# Patient Record
Sex: Male | Born: 1970
Health system: Southern US, Community
[De-identification: ages and names within clinical notes are randomized; demographics above are authoritative.]

## PROBLEM LIST (undated history)

## (undated) DIAGNOSIS — Z8619 Personal history of other infectious and parasitic diseases: Secondary | ICD-10-CM

## (undated) DIAGNOSIS — M1990 Unspecified osteoarthritis, unspecified site: Secondary | ICD-10-CM

## (undated) DIAGNOSIS — E785 Hyperlipidemia, unspecified: Secondary | ICD-10-CM

## (undated) DIAGNOSIS — I10 Essential (primary) hypertension: Secondary | ICD-10-CM

## (undated) DIAGNOSIS — J302 Other seasonal allergic rhinitis: Secondary | ICD-10-CM

## (undated) HISTORY — DX: Unspecified osteoarthritis, unspecified site: M19.90

## (undated) HISTORY — DX: Essential (primary) hypertension: I10

## (undated) HISTORY — DX: Other seasonal allergic rhinitis: J30.2

## (undated) HISTORY — DX: Morbid (severe) obesity due to excess calories: E66.01

## (undated) HISTORY — DX: Hyperlipidemia, unspecified: E78.5

## (undated) HISTORY — DX: Personal history of other infectious and parasitic diseases: Z86.19

---

## 1988-02-26 HISTORY — PX: SHOULDER SURGERY: SHX246

## 2005-11-26 ENCOUNTER — Ambulatory Visit: Payer: Self-pay | Admitting: Internal Medicine

## 2005-12-03 ENCOUNTER — Ambulatory Visit: Payer: Self-pay | Admitting: Internal Medicine

## 2012-06-18 ENCOUNTER — Ambulatory Visit: Payer: Self-pay | Admitting: Family Medicine

## 2012-12-10 LAB — LIPID PANEL
Cholesterol: 223 mg/dL — AB (ref 0–200)
Direct LDL: 150
HDL: 168 mg/dL — AB (ref 35–70)

## 2012-12-10 LAB — CBC
HEMOGLOBIN: 13.6 g/dL
PLATELET COUNT: 239
WBC: 11.6

## 2012-12-10 LAB — PSA: PSA: 0.15

## 2012-12-10 LAB — COMPREHENSIVE METABOLIC PANEL
ALK PHOS: 69 U/L
ALT: 17
AST: 18 U/L
Albumin: 4.3
Creat: 1.41
EGFR: 54 mg/dL
GLUCOSE: 82
Potassium: 4.5 mmol/L
Sodium: 137 mmol/L (ref 137–147)
Total Bilirubin: 0.7 mg/dL

## 2012-12-10 LAB — TSH: TSH: 1.19

## 2013-09-16 ENCOUNTER — Encounter: Payer: Self-pay | Admitting: Family Medicine

## 2013-09-16 ENCOUNTER — Encounter (INDEPENDENT_AMBULATORY_CARE_PROVIDER_SITE_OTHER): Payer: Self-pay

## 2013-09-16 ENCOUNTER — Ambulatory Visit (INDEPENDENT_AMBULATORY_CARE_PROVIDER_SITE_OTHER): Payer: 59 | Admitting: Family Medicine

## 2013-09-16 VITALS — BP 124/78 | HR 100 | Temp 98.4°F | Ht 71.0 in | Wt 288.0 lb

## 2013-09-16 DIAGNOSIS — E669 Obesity, unspecified: Secondary | ICD-10-CM

## 2013-09-16 DIAGNOSIS — I10 Essential (primary) hypertension: Secondary | ICD-10-CM

## 2013-09-16 DIAGNOSIS — J309 Allergic rhinitis, unspecified: Secondary | ICD-10-CM

## 2013-09-16 DIAGNOSIS — E785 Hyperlipidemia, unspecified: Secondary | ICD-10-CM

## 2013-09-16 DIAGNOSIS — J302 Other seasonal allergic rhinitis: Secondary | ICD-10-CM

## 2013-09-16 MED ORDER — LISINOPRIL-HYDROCHLOROTHIAZIDE 20-25 MG PO TABS: 1.0000 | ORAL_TABLET | Freq: Every day | ORAL | Status: DC

## 2013-09-16 NOTE — Progress Notes (Signed)
   BP 124/78  Pulse 100  Temp(Src) 98.4 F (36.9 C) (Oral)  Ht 5\' 11"  (1.803 m)  Wt 288 lb (130.636 kg)  BMI 40.19 kg/m2   CC: new pt to establish  Subjective:    Patient ID: Jorge Armstrong, male    DOB: 1970/11/21, 43 y.o.   MRN: [id number]  HPI: Jorge Armstrong is a 43 y.o. male presenting on 09/16/2013 for Establish Care   [facility] Nehemiah Settle[facility]ite.  HTN - started bp meds November.  Compliant with linisopril hctz 20/25mg  daily in am. Doesn't check bp at home. No HA, vision changes, CP/tightness, SOB, leg swelling.   H/o HLD - off meds.   Morbid obesity - Body mass index is 40.19 kg/(m^2).    Wt Readings from Last 3 Encounters:  09/16/13 288 lb (130.636 kg)     Preventative: Last CPE 12/2012 Tdap 12/2012 Flu 12/2012  Lives with fiance and her son, 1 dog  Occ: Oxygen tank filler, part time job on weekends  Edu: bachelor's  Activity: no reg exercise  Diet: good water, fruits/vegetables some  Relevant past medical, surgical, family and social history reviewed and updated as indicated.  Allergies and medications reviewed and updated. No current outpatient prescriptions on file prior to visit.   No current facility-administered medications on file prior to visit.    Review of Systems Per HPI unless specifically indicated above    Objective:    BP 124/78  Pulse 100  Temp(Src) 98.4 F (36.9 C) (Oral)  Ht 5\' 11"  (1.803 m)  Wt 288 lb (130.636 kg)  BMI 40.19 kg/m2  Physical Exam  Nursing note and vitals reviewed. Constitutional: He appears well-developed and well-nourished. No distress.  HENT:  Mouth/Throat: Oropharynx is clear and moist. No oropharyngeal exudate.  Eyes: Conjunctivae and EOM are normal. Pupils are equal, round, and reactive to light. No scleral icterus.  Neck: No thyromegaly present.  Cardiovascular: Normal rate, regular rhythm, normal heart sounds and intact distal pulses.   No murmur heard. Pulmonary/Chest: Effort normal and breath sounds  normal. No respiratory distress. He has no wheezes. He has no rales.  Musculoskeletal: He exhibits no edema.  Lymphadenopathy:    He has no cervical adenopathy.  Skin: Skin is warm and dry. No rash noted.  Psychiatric: He has a normal mood and affect.   No results found for this or any previous visit.    Assessment & Plan:   Problem List Items Addressed This Visit   Seasonal allergies   Morbid obesity     Reviewed healthy diet and lifestyle changes. Body mass index is 40.19 kg/(m^2).    HTN (hypertension) - Primary     Improved on recheck. Reviewed low salt diet choices and encouraged choosing potassium rich foods - handout provided. DASH diet handout provided. HTN education info provided. States had EKG at prior PCP's office - will await records. No changes indicated today. Encourage working on weight loss and increased activity for better BP control.    Relevant Medications      lisinopril-hydrochlorothiazide (PRINZIDE,ZESTORETIC) 20-25 MG per tablet   HLD (hyperlipidemia)     FLP next visit.    Relevant Medications      lisinopril-hydrochlorothiazide (PRINZIDE,ZESTORETIC) 20-25 MG per tablet       Follow up plan: Return in about 5 months (around 02/16/2014), or as needed, for physical.

## 2013-09-16 NOTE — Assessment & Plan Note (Addendum)
Improved on recheck. Reviewed low salt diet choices and encouraged choosing potassium rich foods - handout provided. DASH diet handout provided. HTN education info provided. States had EKG at prior PCP's office - will await records. No changes indicated today. Encourage working on weight loss and increased activity for better BP control.

## 2013-09-16 NOTE — Assessment & Plan Note (Signed)
FLP next visit.

## 2013-09-16 NOTE — Assessment & Plan Note (Addendum)
Reviewed healthy diet and lifestyle changes. Body mass index is 40.19 kg/(m^2).

## 2013-09-16 NOTE — Patient Instructions (Addendum)
Look into Instant blood pressure app for iphone.  Your goal blood pressure is <140/90. Work on low salt/sodium diet - goal <1.5gm (1,500mg ) per day. Eat a diet high in fruits/vegetables and whole grains.  Look into mediterranean and DASH diet. Goal activity is 12050min/wk of moderate intensity exercise.  This can be split into 30 minute chunks.  If you are not at this level, you can start with smaller 10-15 min increments and slowly build up activity. Look at www.heart.org for more resources.   DASH Eating Plan DASH stands for "Dietary Approaches to Stop Hypertension." The DASH eating plan is a healthy eating plan that has been shown to reduce high blood pressure (hypertension). Additional health benefits may include reducing the risk of type 2 diabetes mellitus, heart disease, and stroke. The DASH eating plan may also help with weight loss. WHAT DO I NEED TO KNOW ABOUT THE DASH EATING PLAN? For the DASH eating plan, you will follow these general guidelines:  Choose foods with a percent daily value for sodium of less than 5% (as listed on the food label).  Use salt-free seasonings or herbs instead of table salt or sea salt.  Check with your health care provider or pharmacist before using salt substitutes.  Eat lower-sodium products, often labeled as "lower sodium" or "no salt added."  Eat fresh foods.  Eat more vegetables, fruits, and low-fat dairy products.  Choose whole grains. Look for the word "whole" as the first word in the ingredient list.  Choose fish and skinless chicken or Mal[place]key more often than red meat. Limit fish, poultry, and meat to 6 oz (170 g) each day.  Limit sweets, desserts, sugars, and sugary drinks.  Choose heart-healthy fats.  Limit cheese to 1 oz (28 g) per day.  Eat more home-cooked food and less restaurant, buffet, and fast food.  Limit fried foods.  Cook foods using methods other than frying.  Limit canned vegetables. If you do use them, rinse them  well to decrease the sodium.  When eating at a restaurant, ask that your food be prepared with less salt, or no salt if possible. WHAT FOODS CAN I EAT? Seek help from a dietitian for individual calorie needs. Grains Whole grain or whole wheat bread. Brown rice. Whole grain or whole wheat pasta. Quinoa, bulgur, and whole grain cereals. Low-sodium cereals. Corn or whole wheat flour tortillas. Whole grain cornbread. Whole grain crackers. Low-sodium crackers. Vegetables Fresh or frozen vegetables (raw, steamed, roasted, or grilled). Low-sodium or reduced-sodium tomato and vegetable juices. Low-sodium or reduced-sodium tomato sauce and paste. Low-sodium or reduced-sodium canned vegetables.  Fruits All fresh, canned (in natural juice), or frozen fruits. Meat and Other Protein Products Ground beef (85% or leaner), grass-fed beef, or beef trimmed of fat. Skinless chicken or [place]key. Ground chicken or [place]key. Pork trimmed of fat. All fish and seafood. Eggs. Dried beans, peas, or lentils. Unsalted nuts and seeds. Unsalted canned beans. Dairy Low-fat dairy products, such as skim or 1% milk, 2% or reduced-fat cheeses, low-fat ricotta or cottage cheese, or plain low-fat yogurt. Low-sodium or reduced-sodium cheeses. Fats and Oils Tub margarines without trans fats. Light or reduced-fat mayonnaise and salad dressings (reduced sodium). Avocado. Safflower, olive, or canola oils. Natural peanut or almond butter. Other Unsalted popcorn and pretzels. The items listed above may not be a complete list of recommended foods or beverages. Contact your dietitian for more options. WHAT FOODS ARE NOT RECOMMENDED? Grains White bread. White pasta. White rice. Refined cornbread. Bagels and croissants. Crackers  that contain trans fat. Vegetables Creamed or fried vegetables. Vegetables in a cheese sauce. Regular canned vegetables. Regular canned tomato sauce and paste. Regular tomato and vegetable juices. Fruits Dried  fruits. Canned fruit in light or heavy syrup. Fruit juice. Meat and Other Protein Products Fatty cuts of meat. Ribs, chicken wings, bacon, sausage, bologna, salami, chitterlings, fatback, hot dogs, bratwurst, and packaged luncheon meats. Salted nuts and seeds. Canned beans with salt. Dairy Whole or 2% milk, cream, half-and-half, and cream cheese. Whole-fat or sweetened yogurt. Full-fat cheeses or blue cheese. Nondairy creamers and whipped toppings. Processed cheese, cheese spreads, or cheese curds. Condiments Onion and garlic salt, seasoned salt, table salt, and sea salt. Canned and packaged gravies. Worcestershire sauce. Tartar sauce. Barbecue sauce. Teriyaki sauce. Soy sauce, including reduced sodium. Steak sauce. Fish sauce. Oyster sauce. Cocktail sauce. Horseradish. Ketchup and mustard. Meat flavorings and tenderizers. Bouillon cubes. Hot sauce. Tabasco sauce. Marinades. Taco seasonings. Relishes. Fats and Oils Butter, stick margarine, lard, shortening, ghee, and bacon fat. Coconut, palm kernel, or palm oils. Regular salad dressings. Other Pickles and olives. Salted popcorn and pretzels. The items listed above may not be a complete list of foods and beverages to avoid. Contact your dietitian for more information. WHERE CAN I FIND MORE INFORMATION? National Heart, Lung, and Blood Institute: [employer] Document Released: 01/31/2011 Document Revised: 06/28/2013 Document Reviewed: 12/16/2012 [facility] Patient Information 2015 [place], [place]. This information is not intended to replace advice given to you by your health care provider. Make sure you discuss any questions you have with your health care provider.

## 2013-09-16 NOTE — Progress Notes (Signed)
Pre visit review using our clinic review tool, if applicable. No additional management support is needed unless otherwise documented below in the visit note. 

## 2013-09-17 ENCOUNTER — Telehealth: Payer: Self-pay | Admitting: Family Medicine

## 2013-09-17 NOTE — Telephone Encounter (Signed)
Relevant patient education assigned to patient using Emmi. ° °

## 2013-10-13 ENCOUNTER — Encounter: Payer: Self-pay | Admitting: *Deleted

## 2014-02-05 ENCOUNTER — Other Ambulatory Visit: Payer: Self-pay | Admitting: Family Medicine

## 2014-02-05 DIAGNOSIS — E785 Hyperlipidemia, unspecified: Secondary | ICD-10-CM

## 2014-02-05 DIAGNOSIS — I10 Essential (primary) hypertension: Secondary | ICD-10-CM

## 2014-02-07 ENCOUNTER — Other Ambulatory Visit: Payer: 59

## 2014-02-11 ENCOUNTER — Encounter: Payer: 59 | Admitting: Family Medicine

## 2014-03-21 ENCOUNTER — Other Ambulatory Visit: Payer: 59

## 2014-03-25 ENCOUNTER — Encounter: Payer: 59 | Admitting: Family Medicine

## 2014-07-23 ENCOUNTER — Ambulatory Visit (INDEPENDENT_AMBULATORY_CARE_PROVIDER_SITE_OTHER): Payer: 59 | Admitting: Family Medicine

## 2014-07-23 ENCOUNTER — Ambulatory Visit (INDEPENDENT_AMBULATORY_CARE_PROVIDER_SITE_OTHER): Payer: 59

## 2014-07-23 VITALS — BP 132/84 | HR 100 | Temp 98.7°F | Resp 18 | Ht 71.0 in | Wt 290.0 lb

## 2014-07-23 DIAGNOSIS — M25561 Pain in right knee: Secondary | ICD-10-CM

## 2014-07-23 DIAGNOSIS — M25461 Effusion, right knee: Secondary | ICD-10-CM

## 2014-07-23 MED ORDER — TRIAMCINOLONE ACETONIDE 40 MG/ML IJ SUSP: 40.0000 mg | Freq: Once | INTRAMUSCULAR | Status: DC

## 2014-07-23 NOTE — Progress Notes (Addendum)
Subjective:  This chart was scribed for Jorge Staggers, MD by [facility], medical scribe at [facility].The patient was seen in exam room 13 and the patient's care was started at 2:06 PM.   Patient ID: Jorge Armstrong, male    DOB: 01-23-1971, 44 y.o.   MRN: [id number] Chief Complaint  Patient presents with  . Knee Pain    right x 1 week    HPI HPI Comments: Jorge Armstrong is a 44 y.o. male who presents to [facility] complaining of right knee pain worsening over the past week. Knee feels swollen. Pt state he has had bilateral knee pain for several yea he was told he has arthritis. No trauma, but his right knee feels unsteady. He has noticed his knees worsen with cold weather.Taking aleve one every 12 hours and topically using bio-freeze. He fill and distributes oxygen tanks.  Patient Active Problem List   Diagnosis Date Noted  . HTN (hypertension)   . HLD (hyperlipidemia)   . Seasonal allergies   . Morbid obesity    Past Medical History  Diagnosis Date  . HTN (hypertension)   . Arthritis     bilateral knees  . History of chicken pox   . Seasonal allergies     spring  . HLD (hyperlipidemia)   . Morbid obesity    Past Surgical History  Procedure Laterality Date  . Shoulder surgery Right 1990   No Known Allergies Prior to Admission medications   Medication Sig Start Date End Date Taking? Authorizing Provider  lisinopril-hydrochlorothiazide (PRINZIDE,ZESTORETIC) 20-25 MG per tablet Take 1 tablet by mouth daily. 09/16/13  Yes Eustaquio Boyden, MD   History   Social History  . Marital Status: Single    Spouse Name: N/A  . Number of Children: N/A  . Years of Education: N/A   Occupational History  . Not on file.   Social History Main Topics  . Smoking status: Never Smoker   . Smokeless tobacco: Never Used  . Alcohol Use: Yes     Comment: Regular (2-3 glasses wine nightly)  . Drug Use: No  . Sexual Activity: Not on file   Other  Topics Concern  . Not on file   Social History Narrative   Lives with fiance and her son, 1 dog    Occ: Oxygen tank filler, part time job on weekends    Edu: bachelor's    Activity: no reg exercise    Diet: good water, fruits/vegetables some   Review of Systems  Musculoskeletal: Positive for joint swelling, arthralgias and gait problem.      Objective:  Physical Exam  Constitutional: He is oriented to person, place, and time. He appears well-developed and well-nourished. No distress.  HENT:  Head: Normocephalic and atraumatic.  Eyes: Pupils are equal, round, and reactive to light.  Neck: Normal range of motion.  Cardiovascular: Normal rate and regular rhythm.   Pulmonary/Chest: Effort normal. No respiratory distress.  Musculoskeletal: Normal range of motion.  Right knee: skin intact, full range of motion slight warmth, approximately 2+ effusion. Slight full popliteal fossa Lateral joint is non tender. Medial joint line is tender. Overall extension is normal. Negative varus and valgus. Guarding on Lachman test but no apparent laxity. Difficult McMurray's testing due to swelling of the knee.  Neurological: He is alert and oriented to person, place, and time.  Skin: Skin is warm and dry.  Psychiatric: He has a normal mood and affect. His behavior  is normal.  Nursing note and vitals reviewed. BP 132/84 mmHg  Pulse 100  Temp(Src) 98.7 F (37.1 C) (Oral)  Resp 18  Ht 5\' 11"  (1.803 m)  Wt 290 lb (131.543 kg)  BMI 40.46 kg/m2  SpO2 98%   [facility] reading (PRIMARY) by  Dr. Neva SeatGreene: R knee: degenerative changes medial greater than lateral joint.  Lateral patellar tilt.   Risks (including but not limited to bleeding and infection, damage to underlying tissues), benefits, and alternatives discussed for R knee superolateral approach injection/aspiration .  Verbal consent obtained after any questions were answered. Landmarks noted, and marked as needed. Area cleansed with Betadine x3, ethyl  chloride spray for topical anesthesia, followed by alcohol swab. 25ga 1/1/2 needle, 28cc clear yellow flud withdrawn. hemostat swab of syringe. Injected with 1cc kenalog, 3cc lidocaine 1% plain. No complications. Bandage applied.  RTC precautions discussed in regards to injection.     Assessment & Plan:   Pleas Jorge Armstrong is a 44 y.o. male Right knee pain - Plan: DG Knee Complete 4 Views Right  Knee swelling, right - Plan: DG Knee Complete 4 Views Right  Suspected longstanding intermittent pain with degenerative disease of knee with possible flair vs. degenerative meniscus tear. Options discussed of conservative care or joint injection - chose to have injection as above. No complications and had some relief after injection.   -sx care at home discussed with brace or ace wrap, activity modification and if not improved in next 2 weeks - consider ortho eval.   -rtc precautions.   No orders of the defined types were placed in this encounter.   Patient Instructions  Ace wrap or over the counter knee brace as needed. Tylenol if needed for pain and elevate leg when seated. If pain not improving over next 2 weeks - let me know and I can refer you to orthopaedic doctor. Return to the clinic or go to the nearest emergency room if any of your symptoms worsen or new symptoms occur.   Knee Pain The knee is the complex joint between your thigh and your lower leg. It is made up of bones, tendons, ligaments, and cartilage. The bones that make up the knee are:  The femur in the thigh.  The tibia and fibula in the lower leg.  The patella or kneecap riding in the groove on the lower femur. CAUSES  Knee pain is a common complaint with many causes. A few of these causes are:  Injury, such as:  A ruptured ligament or tendon injury.  Torn cartilage.  Medical conditions, such as:  Gout  Arthritis  Infections  Overuse, over training, or overdoing a physical activity. Knee pain can be minor or  severe. Knee pain can accompany debilitating injury. Minor knee problems often respond well to self-care measures or get well on their own. More serious injuries may need medical intervention or even surgery. SYMPTOMS The knee is complex. Symptoms of knee problems can vary widely. Some of the problems are:  Pain with movement and weight bearing.  Swelling and tenderness.  Buckling of the knee.  Inability to straighten or extend your knee.  Your knee locks and you cannot straighten it.  Warmth and redness with pain and fever.  Deformity or dislocation of the kneecap. DIAGNOSIS  Determining what is wrong may be very straight forward such as when there is an injury. It can also be challenging because of the complexity of the knee. Tests to make a diagnosis may include:  Your caregiver  taking a history and doing a physical exam.  Routine X-rays can be used to rule out other problems. X-rays will not reveal a cartilage tear. Some injuries of the knee can be diagnosed by:  Arthroscopy a surgical technique by which a small video camera is inserted through tiny incisions on the sides of the knee. This procedure is used to examine and repair internal knee joint problems. Tiny instruments can be used during arthroscopy to repair the torn knee cartilage (meniscus).  Arthrography is a radiology technique. A contrast liquid is directly injected into the knee joint. Internal structures of the knee joint then become visible on X-ray film.  An MRI scan is a non X-ray radiology procedure in which magnetic fields and a computer produce two- or three-dimensional images of the inside of the knee. Cartilage tears are often visible using an MRI scanner. MRI scans have largely replaced arthrography in diagnosing cartilage tears of the knee.  Blood work.  Examination of the fluid that helps to lubricate the knee joint (synovial fluid). This is done by taking a sample out using a needle and a  syringe. TREATMENT The treatment of knee problems depends on the cause. Some of these treatments are:  Depending on the injury, proper casting, splinting, surgery, or physical therapy care will be needed.  Give yourself adequate recovery time. Do not overuse your joints. If you begin to get sore during workout routines, back off. Slow down or do fewer repetitions.  For repetitive activities such as cycling or running, maintain your strength and nutrition.  Alternate muscle groups. For example, if you are a weight lifter, work the upper body on one day and the lower body the next.  Either tight or weak muscles do not give the proper support for your knee. Tight or weak muscles do not absorb the stress placed on the knee joint. Keep the muscles surrounding the knee strong.  Take care of mechanical problems.  If you have flat feet, orthotics or special shoes may help. See your caregiver if you need help.  Arch supports, sometimes with wedges on the inner or outer aspect of the heel, can help. These can shift pressure away from the side of the knee most bothered by osteoarthritis.  A brace called an "unloader" brace also may be used to help ease the pressure on the most arthritic side of the knee.  If your caregiver has prescribed crutches, braces, wraps or ice, use as directed. The acronym for this is PRICE. This means protection, rest, ice, compression, and elevation.  Nonsteroidal anti-inflammatory drugs (NSAIDs), can help relieve pain. But if taken immediately after an injury, they may actually increase swelling. Take NSAIDs with food in your stomach. Stop them if you develop stomach problems. Do not take these if you have a history of ulcers, stomach pain, or bleeding from the bowel. Do not take without your caregiver's approval if you have problems with fluid retention, heart failure, or kidney problems.  For ongoing knee problems, physical therapy may be helpful.  Glucosamine and  chondroitin are over-the-counter dietary supplements. Both may help relieve the pain of osteoarthritis in the knee. These medicines are different from the usual anti-inflammatory drugs. Glucosamine may decrease the rate of cartilage destruction.  Injections of a corticosteroid drug into your knee joint may help reduce the symptoms of an arthritis flare-up. They may provide pain relief that lasts a few months. You may have to wait a few months between injections. The injections do have a small  increased risk of infection, water retention, and elevated blood sugar levels.  Hyaluronic acid injected into damaged joints may ease pain and provide lubrication. These injections may work by reducing inflammation. A series of shots may give relief for as long as 6 months.  Topical painkillers. Applying certain ointments to your skin may help relieve the pain and stiffness of osteoarthritis. Ask your pharmacist for suggestions. Many over the-counter products are approved for temporary relief of arthritis pain.  In some countries, doctors often prescribe topical NSAIDs for relief of chronic conditions such as arthritis and tendinitis. A review of treatment with NSAID creams found that they worked as well as oral medications but without the serious side effects. PREVENTION  Maintain a healthy weight. Extra pounds put more strain on your joints.  Get strong, stay limber. Weak muscles are a common cause of knee injuries. Stretching is important. Include flexibility exercises in your workouts.  Be smart about exercise. If you have osteoarthritis, chronic knee pain or recurring injuries, you may need to change the way you exercise. This does not mean you have to stop being active. If your knees ache after jogging or playing basketball, consider switching to swimming, water aerobics, or other low-impact activities, at least for a few days a week. Sometimes limiting high-impact activities will provide relief.  Make  sure your shoes fit well. Choose footwear that is right for your sport.  Protect your knees. Use the proper gear for knee-sensitive activities. Use kneepads when playing volleyball or laying carpet. Buckle your seat belt every time you drive. Most shattered kneecaps occur in car accidents.  Rest when you are tired. SEEK MEDICAL CARE IF:  You have knee pain that is continual and does not seem to be getting better.  SEEK IMMEDIATE MEDICAL CARE IF:  Your knee joint feels hot to the touch and you have a high fever. MAKE SURE YOU:   Understand these instructions.  Will watch your condition.  Will get help right away if you are not doing well or get worse. Document Released: 12/09/2006 Document Revised: 05/06/2011 Document Reviewed: 12/09/2006 [facility] Patient Information 2015 [place], [place]. This information is not intended to replace advice given to you by your health care provider. Make sure you discuss any questions you have with your health care provider.     I personally performed the services described in this documentation, which was scribed in my presence. The recorded information has been reviewed and considered, and addended by me as needed.

## 2014-07-23 NOTE — Patient Instructions (Signed)
Ace wrap or over the counter knee brace as needed. Tylenol if needed for pain and elevate leg when seated. If pain not improving over next 2 weeks - let me know and I can refer you to orthopaedic doctor. Return to the clinic or go to the nearest emergency room if any of your symptoms worsen or new symptoms occur.   Knee Pain The knee is the complex joint between your thigh and your lower leg. It is made up of bones, tendons, ligaments, and cartilage. The bones that make up the knee are:  The femur in the thigh.  The tibia and fibula in the lower leg.  The patella or kneecap riding in the groove on the lower femur. CAUSES  Knee pain is a common complaint with many causes. A few of these causes are:  Injury, such as:  A ruptured ligament or tendon injury.  Torn cartilage.  Medical conditions, such as:  Gout  Arthritis  Infections  Overuse, over training, or overdoing a physical activity. Knee pain can be minor or severe. Knee pain can accompany debilitating injury. Minor knee problems often respond well to self-care measures or get well on their own. More serious injuries may need medical intervention or even surgery. SYMPTOMS The knee is complex. Symptoms of knee problems can vary widely. Some of the problems are:  Pain with movement and weight bearing.  Swelling and tenderness.  Buckling of the knee.  Inability to straighten or extend your knee.  Your knee locks and you cannot straighten it.  Warmth and redness with pain and fever.  Deformity or dislocation of the kneecap. DIAGNOSIS  Determining what is wrong may be very straight forward such as when there is an injury. It can also be challenging because of the complexity of the knee. Tests to make a diagnosis may include:  Your caregiver taking a history and doing a physical exam.  Routine X-rays can be used to rule out other problems. X-rays will not reveal a cartilage tear. Some injuries of the knee can be  diagnosed by:  Arthroscopy a surgical technique by which a small video camera is inserted through tiny incisions on the sides of the knee. This procedure is used to examine and repair internal knee joint problems. Tiny instruments can be used during arthroscopy to repair the torn knee cartilage (meniscus).  Arthrography is a radiology technique. A contrast liquid is directly injected into the knee joint. Internal structures of the knee joint then become visible on X-ray film.  An MRI scan is a non X-ray radiology procedure in which magnetic fields and a computer produce two- or three-dimensional images of the inside of the knee. Cartilage tears are often visible using an MRI scanner. MRI scans have largely replaced arthrography in diagnosing cartilage tears of the knee.  Blood work.  Examination of the fluid that helps to lubricate the knee joint (synovial fluid). This is done by taking a sample out using a needle and a syringe. TREATMENT The treatment of knee problems depends on the cause. Some of these treatments are:  Depending on the injury, proper casting, splinting, surgery, or physical therapy care will be needed.  Give yourself adequate recovery time. Do not overuse your joints. If you begin to get sore during workout routines, back off. Slow down or do fewer repetitions.  For repetitive activities such as cycling or running, maintain your strength and nutrition.  Alternate muscle groups. For example, if you are a weight lifter, work the upper body  on one day and the lower body the next.  Either tight or weak muscles do not give the proper support for your knee. Tight or weak muscles do not absorb the stress placed on the knee joint. Keep the muscles surrounding the knee strong.  Take care of mechanical problems.  If you have flat feet, orthotics or special shoes may help. See your caregiver if you need help.  Arch supports, sometimes with wedges on the inner or outer aspect of  the heel, can help. These can shift pressure away from the side of the knee most bothered by osteoarthritis.  A brace called an "unloader" brace also may be used to help ease the pressure on the most arthritic side of the knee.  If your caregiver has prescribed crutches, braces, wraps or ice, use as directed. The acronym for this is PRICE. This means protection, rest, ice, compression, and elevation.  Nonsteroidal anti-inflammatory drugs (NSAIDs), can help relieve pain. But if taken immediately after an injury, they may actually increase swelling. Take NSAIDs with food in your stomach. Stop them if you develop stomach problems. Do not take these if you have a history of ulcers, stomach pain, or bleeding from the bowel. Do not take without your caregiver's approval if you have problems with fluid retention, heart failure, or kidney problems.  For ongoing knee problems, physical therapy may be helpful.  Glucosamine and chondroitin are over-the-counter dietary supplements. Both may help relieve the pain of osteoarthritis in the knee. These medicines are different from the usual anti-inflammatory drugs. Glucosamine may decrease the rate of cartilage destruction.  Injections of a corticosteroid drug into your knee joint may help reduce the symptoms of an arthritis flare-up. They may provide pain relief that lasts a few months. You may have to wait a few months between injections. The injections do have a small increased risk of infection, water retention, and elevated blood sugar levels.  Hyaluronic acid injected into damaged joints may ease pain and provide lubrication. These injections may work by reducing inflammation. A series of shots may give relief for as long as 6 months.  Topical painkillers. Applying certain ointments to your skin may help relieve the pain and stiffness of osteoarthritis. Ask your pharmacist for suggestions. Many over the-counter products are approved for temporary relief of  arthritis pain.  In some countries, doctors often prescribe topical NSAIDs for relief of chronic conditions such as arthritis and tendinitis. A review of treatment with NSAID creams found that they worked as well as oral medications but without the serious side effects. PREVENTION  Maintain a healthy weight. Extra pounds put more strain on your joints.  Get strong, stay limber. Weak muscles are a common cause of knee injuries. Stretching is important. Include flexibility exercises in your workouts.  Be smart about exercise. If you have osteoarthritis, chronic knee pain or recurring injuries, you may need to change the way you exercise. This does not mean you have to stop being active. If your knees ache after jogging or playing basketball, consider switching to swimming, water aerobics, or other low-impact activities, at least for a few days a week. Sometimes limiting high-impact activities will provide relief.  Make sure your shoes fit well. Choose footwear that is right for your sport.  Protect your knees. Use the proper gear for knee-sensitive activities. Use kneepads when playing volleyball or laying carpet. Buckle your seat belt every time you drive. Most shattered kneecaps occur in car accidents.  Rest when you are tired. SEEK  MEDICAL CARE IF:  You have knee pain that is continual and does not seem to be getting better.  SEEK IMMEDIATE MEDICAL CARE IF:  Your knee joint feels hot to the touch and you have a high fever. MAKE SURE YOU:   Understand these instructions.  Will watch your condition.  Will get help right away if you are not doing well or get worse. Document Released: 12/09/2006 Document Revised: 05/06/2011 Document Reviewed: 12/09/2006 [facility] Patient Information 2015 [place], [place]. This information is not intended to replace advice given to you by your health care provider. Make sure you discuss any questions you have with your health care provider.

## 2014-09-15 ENCOUNTER — Other Ambulatory Visit: Payer: Self-pay | Admitting: Family Medicine

## 2014-10-10 ENCOUNTER — Encounter: Payer: Self-pay | Admitting: Family Medicine

## 2014-10-10 ENCOUNTER — Ambulatory Visit (INDEPENDENT_AMBULATORY_CARE_PROVIDER_SITE_OTHER): Payer: 59 | Admitting: Family Medicine

## 2014-10-10 VITALS — BP 155/105 | HR 64 | Temp 98.3°F | Wt 301.5 lb

## 2014-10-10 DIAGNOSIS — M25561 Pain in right knee: Secondary | ICD-10-CM | POA: Diagnosis not present

## 2014-10-10 DIAGNOSIS — E785 Hyperlipidemia, unspecified: Secondary | ICD-10-CM

## 2014-10-10 DIAGNOSIS — I10 Essential (primary) hypertension: Secondary | ICD-10-CM | POA: Diagnosis not present

## 2014-10-10 MED ORDER — LISINOPRIL-HYDROCHLOROTHIAZIDE 20-25 MG PO TABS
1.0000 | ORAL_TABLET | Freq: Every day | ORAL | Status: DC
Start: 2014-10-10 — End: 2015-10-18

## 2014-10-10 MED ORDER — NAPROXEN 500 MG PO TABS: 500.0000 mg | ORAL_TABLET | Freq: Two times a day (BID) | ORAL | Status: DC | PRN

## 2014-10-10 NOTE — Assessment & Plan Note (Signed)
Chronic, did not take meds today anticipate bp elevated for this reason. Refilled meds. Check BMP today.

## 2014-10-10 NOTE — Progress Notes (Signed)
Pre visit review using our clinic review tool, if applicable. No additional management support is needed unless otherwise documented below in the visit note. 

## 2014-10-10 NOTE — Patient Instructions (Addendum)
I've refilled your medicines today. Blood work today Goal blood pressure <140/90. Let us know if consistently running higher. Consider buying cuff. For right knee - I think you have arthritis flare and possible meniscal injury. Treat with anti inflammatory course for next 3-4 weeks then return for re evaluation. Good to see you today, call us with questions.

## 2014-10-10 NOTE — Progress Notes (Signed)
BP 155/105 mmHg  Pulse 64  Temp(Src) 98.3 F (36.8 C) (Oral)  Wt 301 lb 8 oz (136.76 kg)   CC: med refill visit  Subjective:    Patient ID: Jorge Armstrong, male    DOB: [birth year], 44 y.o.   MRN: [id number]  HPI: Jorge Armstrong is a 44 y.o. male presenting on 10/10/2014 for Medication Refill and Knee Pain   Last seen 08/2013.   HTN - Compliant with current antihypertensive regimen of lisinopril 20/hctz  daily.  Does not check blood pressures at home. No low blood pressure symptoms of dizziness/syncope.  Denies HA, vision changes, CP/tightness, SOB, leg swelling.  Did NOT take med this morning (forgot).  Seen recently by [place] 06/2014 with R knee pain - OA flare vs degenerative meniscal tear s/p aspiration and steroid injection of knee - this helped. Ongoing issues with R>L knee for last 4-5 months. Knee swelling, worse pain with prolonged rest. Denies inciting trauma/injury of knee. Rare redness/warmth. ++ swelling. No other joint affected. Main concern is inability to put all his weight on right knee. No locking or instability. Self treated with advil.   Relevant past medical, surgical, family and social history reviewed and updated as indicated. Interim medical history since our last visit reviewed. Allergies and medications reviewed and updated. No current outpatient prescriptions on file prior to visit.   No current facility-administered medications on file prior to visit.    Review of Systems Per HPI unless specifically indicated above     Objective:    BP 155/105 mmHg  Pulse 64  Temp(Src) 98.3 F (36.8 C) (Oral)  Wt 301 lb 8 oz (136.76 kg)  Wt Readings from Last 3 Encounters:  10/10/14 301 lb 8 oz (136.76 kg)  07/23/14 290 lb (131.543 kg)  09/16/13 288 lb (130.636 kg)   Body mass index is 42.07 kg/(m^2).  Physical Exam  Constitutional: He appears well-developed and well-nourished. No distress.  HENT:  Mouth/Throat: Oropharynx is clear and moist. No oropharyngeal  exudate.  Cardiovascular: Normal rate, regular rhythm, normal heart sounds and intact distal pulses.   No murmur heard. Pulmonary/Chest: Effort normal and breath sounds normal. No respiratory distress. He has no wheezes. He has no rales.  Musculoskeletal: He exhibits edema.  L knee exam: FROM, no pain to palpation R Knee exam: Effusion/swelling at knee joint  + pain with palpation of lateral superior to patella FROM in flex/extension without crepitus. No popliteal fullness. Neg drawer test. Discomfort with mcmurray test. No pain with valgus/varus stress. + PFgrind. No abnormal patellar mobility.  Skin: Skin is warm and dry. No rash noted.  Psychiatric: He has a normal mood and affect.  Nursing note and vitals reviewed.      Assessment & Plan:   Problem List Items Addressed This Visit    HTN (hypertension) - Primary    Chronic, did not take meds today anticipate bp elevated for this reason. Refilled meds. Check BMP today.      Relevant Medications   lisinopril-hydrochlorothiazide (PRINZIDE,ZESTORETIC) 20-25 MG per tablet   Other Relevant Orders   Comprehensive metabolic panel   HLD (hyperlipidemia)    Check FLP as pt fasting.      Relevant Medications   lisinopril-hydrochlorothiazide (PRINZIDE,ZESTORETIC) 20-25 MG per tablet   Other Relevant Orders   Lipid panel   Comprehensive metabolic panel   Obesity, Class III, BMI 40-49.9 (morbid obesity)    Body mass index is 42.07 kg/(m^2).  Check LFTs      Right knee pain  Osteoarthritis vs meniscal injury. Check uric acid today to r/o gout. Treat with anti inflammatory med as well as ice. Keep leg elevated.  RTC 3-4 wks for f/u, consider re aspiration/steroid injection if no improvement and referral to ortho. Pt agrees with plan.      Relevant Orders   Uric acid       Follow up plan: Return in about 3 weeks (around 10/31/2014), or as needed, for follow up visit.

## 2014-10-10 NOTE — Assessment & Plan Note (Signed)
Body mass index is 42.07 kg/(m^2).  Check LFTs

## 2014-10-10 NOTE — Assessment & Plan Note (Signed)
Osteoarthritis vs meniscal injury. Check uric acid today to r/o gout. Treat with anti inflammatory med as well as ice. Keep leg elevated.  RTC 3-4 wks for f/u, consider re aspiration/steroid injection if no improvement and referral to ortho. Pt agrees with plan.

## 2014-10-10 NOTE — Assessment & Plan Note (Signed)
Check FLP as pt fasting.

## 2014-10-11 LAB — LIPID PANEL
CHOLESTEROL: 203 mg/dL — AB (ref 0–200)
HDL: 47.4 mg/dL (ref >39.00)
LDL Cholesterol: 134 mg/dL — ABNORMAL HIGH (ref 0–99)
NonHDL: 155.69
Total CHOL/HDL Ratio: 4
Triglycerides: 110 mg/dL (ref 0.0–149.0)
VLDL: 22 mg/dL (ref 0.0–40.0)

## 2014-10-11 LAB — COMPREHENSIVE METABOLIC PANEL
ALBUMIN: 3.9 g/dL (ref 3.5–5.2)
ALK PHOS: 49 U/L (ref 39–117)
ALT: 14 U/L (ref 0–53)
AST: 17 U/L (ref 0–37)
BUN: 15 mg/dL (ref 6–23)
CO2: 30 mEq/L (ref 19–32)
CREATININE: 1.42 mg/dL (ref 0.40–1.50)
Calcium: 9.7 mg/dL (ref 8.4–10.5)
Chloride: 104 mEq/L (ref 96–112)
GFR: 68.9 mL/min (ref >60.00)
Glucose, Bld: 87 mg/dL (ref 70–99)
Potassium: 4.3 mEq/L (ref 3.5–5.1)
Sodium: 139 mEq/L (ref 135–145)
TOTAL PROTEIN: 7.5 g/dL (ref 6.0–8.3)
Total Bilirubin: 0.4 mg/dL (ref 0.2–1.2)

## 2014-10-11 LAB — URIC ACID: URIC ACID, SERUM: 7.3 mg/dL (ref 4.0–7.8)

## 2014-10-14 ENCOUNTER — Other Ambulatory Visit: Payer: Self-pay | Admitting: Family Medicine

## 2015-10-18 ENCOUNTER — Other Ambulatory Visit: Payer: Self-pay | Admitting: Family Medicine

## 2015-10-26 ENCOUNTER — Encounter: Payer: Self-pay | Admitting: Family Medicine

## 2015-10-26 ENCOUNTER — Ambulatory Visit (INDEPENDENT_AMBULATORY_CARE_PROVIDER_SITE_OTHER): Payer: 59 | Admitting: Family Medicine

## 2015-10-26 VITALS — BP 144/96 | HR 79 | Temp 98.8°F | Wt 315.2 lb

## 2015-10-26 DIAGNOSIS — M25561 Pain in right knee: Secondary | ICD-10-CM | POA: Diagnosis not present

## 2015-10-26 DIAGNOSIS — E785 Hyperlipidemia, unspecified: Secondary | ICD-10-CM

## 2015-10-26 DIAGNOSIS — I10 Essential (primary) hypertension: Secondary | ICD-10-CM

## 2015-10-26 DIAGNOSIS — N529 Male erectile dysfunction, unspecified: Secondary | ICD-10-CM

## 2015-10-26 MED ORDER — SILDENAFIL CITRATE 100 MG PO TABS: 50.0000 mg | ORAL_TABLET | Freq: Every day | ORAL | 4 refills | Status: DC | PRN

## 2015-10-26 MED ORDER — LISINOPRIL-HYDROCHLOROTHIAZIDE 20-25 MG PO TABS: 1.0000 | ORAL_TABLET | Freq: Every day | ORAL | 3 refills | Status: DC

## 2015-10-26 NOTE — Assessment & Plan Note (Signed)
Discussed healthy diet and lifestyle changes to affect sustainable weight loss  

## 2015-10-26 NOTE — Assessment & Plan Note (Signed)
1.5 yr h/o this. OA vs meniscal injury - discussed ortho referral. Pt will let me know when he desires referral.

## 2015-10-26 NOTE — Assessment & Plan Note (Signed)
Discussed etiology of this along with treatment. Will trial PDE5 inhibitor. Discussed side effects (HA, flushing), monitor for priapism, pt knows to avoid nitrates if on viagra. If chest pain, to stop sex and immediately let me know.

## 2015-10-26 NOTE — Assessment & Plan Note (Signed)
Chronic, stable. Ran out of med a few days ago. bp mildly elevated today. Will restart today.

## 2015-10-26 NOTE — Progress Notes (Signed)
Pre visit review using our clinic review tool, if applicable. No additional management support is needed unless otherwise documented below in the visit note. 

## 2015-10-26 NOTE — Assessment & Plan Note (Signed)
Off meds. Check FLP.

## 2015-10-26 NOTE — Progress Notes (Signed)
BP (!) 144/96   Pulse 79   Temp 98.8 F (37.1 C) (Oral)   Wt (!) 315 lb 4 oz (143 kg)   SpO2 98%   BMI 43.97 kg/m    CC: med refill, knee pain Subjective:    Patient ID: Jorge Armstrong, male    DOB: 12-May-1970, 45 y.o.   MRN: [id number]  HPI: Jorge Armstrong is a 45 y.o. male presenting on 10/26/2015 for Blood Pressure Check and Knee Pain (and swelling)   HTN - Compliant with current antihypertensive regimen of lisinopril hctz, ran out over last few days. Does check blood pressures at home. No low blood pressure readings or symptoms of dizziness/syncope. Denies HA, vision changes, CP/tightness, SOB, leg swelling.   Obesity - weight gain noted. Stationary bicycle aggravates knee pain. Does not have healthy diet. Lots of cook outs during summer. Wants to return to salads.   Ongoing R>L knee pain over last 1.5 yr. OA vs degenerative meniscal tear s/p aspiration and steroid injection 06/2014. Denies inciting trauma/injury. Knees don't lock or give out. Worse with prolonged sitting.  Also - over last 1-2 months noticing some trouble maintaining erection. Requests PRN med for this.   Relevant past medical, surgical, family and social history reviewed and updated as indicated. Interim medical history since our last visit reviewed. Allergies and medications reviewed and updated. No current outpatient prescriptions on file prior to visit.   No current facility-administered medications on file prior to visit.     Review of Systems Per HPI unless specifically indicated in ROS section     Objective:    BP (!) 144/96   Pulse 79   Temp 98.8 F (37.1 C) (Oral)   Wt (!) 315 lb 4 oz (143 kg)   SpO2 98%   BMI 43.97 kg/m   Wt Readings from Last 3 Encounters:  10/26/15 (!) 315 lb 4 oz (143 kg)  10/10/14 (!) 301 lb 8 oz (136.8 kg)  07/23/14 290 lb (131.5 kg)    Physical Exam  Constitutional: He appears well-developed and well-nourished. No distress.  HENT:  Mouth/Throat: Oropharynx is  clear and moist. No oropharyngeal exudate.  Cardiovascular: Normal rate, regular rhythm, normal heart sounds and intact distal pulses.   No murmur heard. Pulmonary/Chest: Effort normal and breath sounds normal. No respiratory distress. He has no wheezes. He has no rales.  Musculoskeletal: He exhibits no edema.  Skin: Skin is warm and dry. No rash noted.  Psychiatric: He has a normal mood and affect.  Nursing note and vitals reviewed.  Results for orders placed or performed in visit on 10/10/14  Lipid panel  Result Value Ref Range   Cholesterol 203 (H) 0 - 200 mg/dL   Triglycerides 409.8 0.0 - 149.0 mg/dL   HDL 11.91 >47.82 mg/dL   VLDL 95.6 0.0 - 21.3 mg/dL   LDL Cholesterol 086 (H) 0 - 99 mg/dL   Total CHOL/HDL Ratio 4    NonHDL 155.69   Comprehensive metabolic panel  Result Value Ref Range   Sodium 139 135 - 145 mEq/L   Potassium 4.3 3.5 - 5.1 mEq/L   Chloride 104 96 - 112 mEq/L   CO2 30 19 - 32 mEq/L   Glucose, Bld 87 70 - 99 mg/dL   BUN 15 6 - 23 mg/dL   Creatinine, Ser 5.78 0.40 - 1.50 mg/dL   Total Bilirubin 0.4 0.2 - 1.2 mg/dL   Alkaline Phosphatase 49 39 - 117 U/L   AST 17 0 - 37  U/L   ALT 14 0 - 53 U/L   Total Protein 7.5 6.0 - 8.3 g/dL   Albumin 3.9 3.5 - 5.2 g/dL   Calcium 9.7 8.4 - 96.010.5 mg/dL   GFR 45.4068.90 >98.11>60.00 mL/min  Uric acid  Result Value Ref Range   Uric Acid, Serum 7.3 4.0 - 7.8 mg/dL      Assessment & Plan:   Problem List Items Addressed This Visit    Erectile dysfunction    Discussed etiology of this along with treatment. Will trial PDE5 inhibitor. Discussed side effects (HA, flushing), monitor for priapism, pt knows to avoid nitrates if on viagra. If chest pain, to stop sex and immediately let me know.      HLD (hyperlipidemia)    Off meds. Check FLP.       Relevant Medications   lisinopril-hydrochlorothiazide (PRINZIDE,ZESTORETIC) 20-25 MG tablet   sildenafil (VIAGRA) 100 MG tablet   Other Relevant Orders   Lipid panel   Basic metabolic  panel   HTN (hypertension) - Primary    Chronic, stable. Ran out of med a few days ago. bp mildly elevated today. Will restart today.       Relevant Medications   lisinopril-hydrochlorothiazide (PRINZIDE,ZESTORETIC) 20-25 MG tablet   sildenafil (VIAGRA) 100 MG tablet   Obesity, Class III, BMI 40-49.9 (morbid obesity) ([facility])    Discussed healthy diet and lifestyle changes to affect sustainable weight loss.      Right knee pain    1.5 yr h/o this. OA vs meniscal injury - discussed ortho referral. Pt will let me know when he desires referral.       Other Visit Diagnoses   None.      Follow up plan: Return in about 1 year (around 10/25/2016), or as needed, for annual exam, prior fasting for blood work.  Eustaquio BoydenJavier Liadan Guizar, MD

## 2015-10-26 NOTE — Patient Instructions (Addendum)
meds refilled today. Return as needed or in 1 year for a physical. Let me know if you'd like referral to ortho for further evaluation of knees. In interim, work on staying active, healthy diet choices for goal weight loss.

## 2015-10-27 LAB — LIPID PANEL
CHOL/HDL RATIO: 4
Cholesterol: 226 mg/dL — ABNORMAL HIGH (ref 0–200)
HDL: 55.7 mg/dL (ref >39.00)
LDL Cholesterol: 150 mg/dL — ABNORMAL HIGH (ref 0–99)
NONHDL: 170.63
Triglycerides: 102 mg/dL (ref 0.0–149.0)
VLDL: 20.4 mg/dL (ref 0.0–40.0)

## 2015-10-27 LAB — BASIC METABOLIC PANEL
BUN: 17 mg/dL (ref 6–23)
CALCIUM: 9.1 mg/dL (ref 8.4–10.5)
CHLORIDE: 105 meq/L (ref 96–112)
CO2: 26 mEq/L (ref 19–32)
CREATININE: 1.71 mg/dL — AB (ref 0.40–1.50)
GFR: 55.35 mL/min — ABNORMAL LOW (ref >60.00)
Glucose, Bld: 90 mg/dL (ref 70–99)
Potassium: 3.9 mEq/L (ref 3.5–5.1)
Sodium: 138 mEq/L (ref 135–145)

## 2015-10-28 ENCOUNTER — Other Ambulatory Visit: Payer: Self-pay | Admitting: Family Medicine

## 2015-10-28 DIAGNOSIS — I10 Essential (primary) hypertension: Secondary | ICD-10-CM

## 2015-10-28 DIAGNOSIS — N179 Acute kidney failure, unspecified: Secondary | ICD-10-CM

## 2015-11-22 ENCOUNTER — Other Ambulatory Visit (INDEPENDENT_AMBULATORY_CARE_PROVIDER_SITE_OTHER): Payer: 59

## 2015-11-22 DIAGNOSIS — I10 Essential (primary) hypertension: Secondary | ICD-10-CM | POA: Diagnosis not present

## 2015-11-22 DIAGNOSIS — N179 Acute kidney failure, unspecified: Secondary | ICD-10-CM | POA: Diagnosis not present

## 2015-11-23 LAB — CBC WITH DIFFERENTIAL/PLATELET
Basophils Absolute: 0 10*3/uL (ref 0.0–0.1)
Basophils Relative: 0.4 % (ref 0.0–3.0)
Eosinophils Absolute: 0.2 10*3/uL (ref 0.0–0.7)
Eosinophils Relative: 1.7 % (ref 0.0–5.0)
HCT: 42.2 % (ref 39.0–52.0)
Hemoglobin: 13.9 g/dL (ref 13.0–17.0)
Lymphocytes Relative: 17.7 % (ref 12.0–46.0)
Lymphs Abs: 1.9 10*3/uL (ref 0.7–4.0)
MCHC: 32.8 g/dL (ref 30.0–36.0)
MCV: 82.3 fl (ref 78.0–100.0)
Monocytes Absolute: 0.7 10*3/uL (ref 0.1–1.0)
Monocytes Relative: 6.5 % (ref 3.0–12.0)
Neutro Abs: 7.7 10*3/uL (ref 1.4–7.7)
Neutrophils Relative %: 73.7 % (ref 43.0–77.0)
Platelets: 262 10*3/uL (ref 150.0–400.0)
RBC: 5.13 Mil/uL (ref 4.22–5.81)
RDW: 14.5 % (ref 11.5–15.5)
WBC: 10.5 10*3/uL (ref 4.0–10.5)

## 2015-11-23 LAB — RENAL FUNCTION PANEL
ALBUMIN: 3.9 g/dL (ref 3.5–5.2)
BUN: 16 mg/dL (ref 6–23)
CALCIUM: 9.5 mg/dL (ref 8.4–10.5)
CO2: 29 meq/L (ref 19–32)
Chloride: 103 mEq/L (ref 96–112)
Creatinine, Ser: 1.38 mg/dL (ref 0.40–1.50)
GFR: 70.87 mL/min (ref >60.00)
GLUCOSE: 77 mg/dL (ref 70–99)
PHOSPHORUS: 4.2 mg/dL (ref 2.3–4.6)
POTASSIUM: 4 meq/L (ref 3.5–5.1)
SODIUM: 141 meq/L (ref 135–145)

## 2015-11-23 LAB — MICROALBUMIN / CREATININE URINE RATIO
Creatinine,U: 303.1 mg/dL
MICROALB UR: 1 mg/dL (ref 0.0–1.9)
Microalb Creat Ratio: 0.3 mg/g (ref 0.0–30.0)

## 2015-11-30 ENCOUNTER — Encounter: Payer: Self-pay | Admitting: Family Medicine

## 2015-11-30 ENCOUNTER — Ambulatory Visit (INDEPENDENT_AMBULATORY_CARE_PROVIDER_SITE_OTHER): Payer: 59 | Admitting: Family Medicine

## 2015-11-30 VITALS — BP 130/72 | HR 96 | Temp 98.5°F | Wt 301.5 lb

## 2015-11-30 DIAGNOSIS — I10 Essential (primary) hypertension: Secondary | ICD-10-CM

## 2015-11-30 NOTE — Progress Notes (Signed)
Pre visit review using our clinic review tool, if applicable. No additional management support is needed unless otherwise documented below in the visit note. 

## 2015-11-30 NOTE — Patient Instructions (Signed)
Kidneys are doing better Return in 11-12 months for next physical You are doing well today. Keep up the good work.

## 2015-11-30 NOTE — Progress Notes (Signed)
BP 130/72   Pulse 96   Temp 98.5 F (36.9 C) (Oral)   Wt (!) 301 lb 8 oz (136.8 kg)   BMI 42.05 kg/m    CC: f/u visit Subjective:    Patient ID: Jorge Armstrong, male    DOB: 1970-03-26, 45 y.o.   MRN: [id number]  HPI: Jorge Armstrong is a 45 y.o. male presenting on 11/30/2015 for Follow-up   HTN - Compliant with current antihypertensive regimen of lisinopril hctz.  Does not check blood pressures at home. No BP cuff. No low blood pressure readings or symptoms of dizziness/syncope. Denies HA, vision changes, CP/tightness, SOB, leg swelling.   14 lb weight loss over last month. Restarted regular exercise a few days a week, cut down on late night snacking. More vegetables at night.   Relevant past medical, surgical, family and social history reviewed and updated as indicated. Interim medical history since our last visit reviewed. Allergies and medications reviewed and updated. Current Outpatient Prescriptions on File Prior to Visit  Medication Sig  . lisinopril-hydrochlorothiazide (PRINZIDE,ZESTORETIC) 20-25 MG tablet Take 0.5 tablets by mouth daily.  . sildenafil (VIAGRA) 100 MG tablet Take 0.5-1 tablets (50-100 mg total) by mouth daily as needed for erectile dysfunction.   No current facility-administered medications on file prior to visit.     Review of Systems Per HPI unless specifically indicated in ROS section     Objective:    BP 130/72   Pulse 96   Temp 98.5 F (36.9 C) (Oral)   Wt (!) 301 lb 8 oz (136.8 kg)   BMI 42.05 kg/m   Wt Readings from Last 3 Encounters:  11/30/15 (!) 301 lb 8 oz (136.8 kg)  10/26/15 (!) 315 lb 4 oz (143 kg)  10/10/14 (!) 301 lb 8 oz (136.8 kg)    Physical Exam  Constitutional: He appears well-developed and well-nourished. No distress.  HENT:  Mouth/Throat: Oropharynx is clear and moist. No oropharyngeal exudate.  Cardiovascular: Normal rate, regular rhythm, normal heart sounds and intact distal pulses.   No murmur  heard. Pulmonary/Chest: Effort normal and breath sounds normal. No respiratory distress. He has no wheezes. He has no rales.  Musculoskeletal: He exhibits no edema.  Psychiatric: He has a normal mood and affect.  Nursing note and vitals reviewed.  Results for orders placed or performed in visit on 11/22/15  Renal function panel  Result Value Ref Range   Sodium 141 135 - 145 mEq/L   Potassium 4.0 3.5 - 5.1 mEq/L   Chloride 103 96 - 112 mEq/L   CO2 29 19 - 32 mEq/L   Calcium 9.5 8.4 - 10.5 mg/dL   Albumin 3.9 3.5 - 5.2 g/dL   BUN 16 6 - 23 mg/dL   Creatinine, Ser 1.38 0.40 - 1.50 mg/dL   Glucose, Bld 77 70 - 99 mg/dL   Phosphorus 4.2 2.3 - 4.6 mg/dL   GFR 70.87 >60.00 mL/min  CBC with Differential/Platelet  Result Value Ref Range   WBC 10.5 4.0 - 10.5 K/uL   RBC 5.13 4.22 - 5.81 Mil/uL   Hemoglobin 13.9 13.0 - 17.0 g/dL   HCT 42.2 39.0 - 52.0 %   MCV 82.3 78.0 - 100.0 fl   MCHC 32.8 30.0 - 36.0 g/dL   RDW 14.5 11.5 - 15.5 %   Platelets 262.0 150.0 - 400.0 K/uL   Neutrophils Relative % 73.7 43.0 - 77.0 %   Lymphocytes Relative 17.7 12.0 - 46.0 %   Monocytes Relative 6.5 3.0 -  12.0 %   Eosinophils Relative 1.7 0.0 - 5.0 %   Basophils Relative 0.4 0.0 - 3.0 %   Neutro Abs 7.7 1.4 - 7.7 K/uL   Lymphs Abs 1.9 0.7 - 4.0 K/uL   Monocytes Absolute 0.7 0.1 - 1.0 K/uL   Eosinophils Absolute 0.2 0.0 - 0.7 K/uL   Basophils Absolute 0.0 0.0 - 0.1 K/uL  Microalbumin / creatinine urine ratio  Result Value Ref Range   Microalb, Ur 1.0 0.0 - 1.9 mg/dL   Creatinine,U 303.1 mg/dL   Microalb Creat Ratio 0.3 0.0 - 30.0 mg/g      Assessment & Plan:   Problem List Items Addressed This Visit    HTN (hypertension) - Primary    Chronic, stable. Prior renal function with new insufficiency with Cr 1.7 and eGFR 55%. However now with better bp control, renal function has returned to normal. Unclear significance of recent renal insufficiency Reviewed recent labs with patient.      Obesity,  Class III, BMI 40-49.9 (morbid obesity) ([place])    Congratulated on weight loss to date. Pt feels recent changes are sustainable (less late night snacking, more aerobic exercise).        Other Visit Diagnoses   None.      Follow up plan: Return in about 1 year (around 11/29/2016) for annual exam, prior fasting for blood work.  Ria Bush, MD

## 2015-11-30 NOTE — Assessment & Plan Note (Addendum)
Chronic, stable. Prior renal function with new insufficiency with Cr 1.7 and eGFR 55%. However now with better bp control, renal function has returned to normal. Unclear significance of recent renal insufficiency Reviewed recent labs with patient.

## 2015-11-30 NOTE — Assessment & Plan Note (Signed)
Congratulated on weight loss to date. Pt feels recent changes are sustainable (less late night snacking, more aerobic exercise).

## 2016-09-30 ENCOUNTER — Other Ambulatory Visit: Payer: Self-pay | Admitting: Family Medicine

## 2016-10-01 MED ORDER — LISINOPRIL-HYDROCHLOROTHIAZIDE 20-25 MG PO TABS: 0.5000 | ORAL_TABLET | Freq: Every day | ORAL | 0 refills | Status: DC

## 2016-11-19 ENCOUNTER — Other Ambulatory Visit: Payer: Self-pay | Admitting: Family Medicine

## 2016-11-19 MED ORDER — LISINOPRIL-HYDROCHLOROTHIAZIDE 20-25 MG PO TABS: 0.5000 | ORAL_TABLET | Freq: Every day | ORAL | 0 refills | Status: DC

## 2016-11-29 ENCOUNTER — Other Ambulatory Visit: Payer: Self-pay | Admitting: Family Medicine

## 2016-11-29 MED ORDER — LISINOPRIL-HYDROCHLOROTHIAZIDE 20-25 MG PO TABS: 0.5000 | ORAL_TABLET | Freq: Every day | ORAL | 3 refills | Status: DC

## 2016-12-16 ENCOUNTER — Emergency Department (HOSPITAL_COMMUNITY)
Admission: EM | Admit: 2016-12-16 | Discharge: 2016-12-17 | Disposition: A | Discharge status: Home / Self Care | Payer: 59 | Attending: Emergency Medicine | Admitting: Emergency Medicine

## 2016-12-16 ENCOUNTER — Encounter (HOSPITAL_COMMUNITY): Payer: Self-pay | Admitting: Emergency Medicine

## 2016-12-16 DIAGNOSIS — Z5321 Procedure and treatment not carried out due to patient leaving prior to being seen by health care provider: Secondary | ICD-10-CM | POA: Diagnosis not present

## 2016-12-16 DIAGNOSIS — R109 Unspecified abdominal pain: Secondary | ICD-10-CM | POA: Diagnosis present

## 2016-12-16 NOTE — ED Triage Notes (Signed)
Patient complaining of flank pain. Patient states it started a dull pain for about a year. Today he states has gotten worse today. Patient is not complaining of any other symptoms.

## 2016-12-17 ENCOUNTER — Telehealth: Payer: Self-pay | Admitting: Family Medicine

## 2016-12-17 ENCOUNTER — Encounter (HOSPITAL_COMMUNITY): Payer: Self-pay | Admitting: Emergency Medicine

## 2016-12-17 LAB — URINALYSIS, ROUTINE W REFLEX MICROSCOPIC
Bilirubin Urine: NEGATIVE
GLUCOSE, UA: NEGATIVE mg/dL
Hgb urine dipstick: NEGATIVE
KETONES UR: NEGATIVE mg/dL
LEUKOCYTES UA: NEGATIVE
Nitrite: NEGATIVE
PH: 7 (ref 5.0–8.0)
Protein, ur: NEGATIVE mg/dL
Specific Gravity, Urine: 1.018 (ref 1.005–1.030)

## 2016-12-17 LAB — COMPREHENSIVE METABOLIC PANEL
ALBUMIN: 3.8 g/dL (ref 3.5–5.0)
ALK PHOS: 55 U/L (ref 38–126)
ALT: 19 U/L (ref 17–63)
AST: 20 U/L (ref 15–41)
Anion gap: 12 (ref 5–15)
BILIRUBIN TOTAL: 0.7 mg/dL (ref 0.3–1.2)
BUN: 16 mg/dL (ref 6–20)
CO2: 26 mmol/L (ref 22–32)
Calcium: 9.5 mg/dL (ref 8.9–10.3)
Chloride: 100 mmol/L — ABNORMAL LOW (ref 101–111)
Creatinine, Ser: 1.41 mg/dL — ABNORMAL HIGH (ref 0.61–1.24)
GFR calc Af Amer: >60 mL/min (ref >60)
GFR calc non Af Amer: 58 mL/min — ABNORMAL LOW (ref >60)
GLUCOSE: 102 mg/dL — AB (ref 65–99)
Potassium: 3.6 mmol/L (ref 3.5–5.1)
Sodium: 138 mmol/L (ref 135–145)
Total Protein: 7.5 g/dL (ref 6.5–8.1)

## 2016-12-17 LAB — CBC
HCT: 43.2 % (ref 39.0–52.0)
Hemoglobin: 14.3 g/dL (ref 13.0–17.0)
MCH: 27.9 pg (ref 26.0–34.0)
MCHC: 33.1 g/dL (ref 30.0–36.0)
MCV: 84.2 fL (ref 78.0–100.0)
Platelets: 251 10*3/uL (ref 150–400)
RBC: 5.13 MIL/uL (ref 4.22–5.81)
RDW: 14.6 % (ref 11.5–15.5)
WBC: 13 10*3/uL — AB (ref 4.0–10.5)

## 2016-12-17 LAB — LIPASE, BLOOD: Lipase: 20 U/L (ref 11–51)

## 2016-12-17 NOTE — Telephone Encounter (Signed)
Seen at ER overnight with flank pain, left due to long wait.  Would call and offer OV for evaluation if ongoing pain.

## 2016-12-17 NOTE — ED Notes (Signed)
Patient stated he could not wait any longer and left.

## 2016-12-18 NOTE — Telephone Encounter (Signed)
Left message on vm per dpr relaying message per Dr. G.  

## 2016-12-19 ENCOUNTER — Ambulatory Visit (INDEPENDENT_AMBULATORY_CARE_PROVIDER_SITE_OTHER)
Admission: RE | Admit: 2016-12-19 | Discharge: 2016-12-19 | Disposition: A | Discharge status: Home / Self Care | Payer: 59 | Source: Ambulatory Visit | Attending: Family Medicine | Admitting: Family Medicine

## 2016-12-19 ENCOUNTER — Encounter: Payer: Self-pay | Admitting: Family Medicine

## 2016-12-19 ENCOUNTER — Ambulatory Visit (INDEPENDENT_AMBULATORY_CARE_PROVIDER_SITE_OTHER): Payer: 59 | Admitting: Family Medicine

## 2016-12-19 ENCOUNTER — Other Ambulatory Visit: Payer: Self-pay | Admitting: Family Medicine

## 2016-12-19 VITALS — BP 136/88 | HR 87 | Temp 98.1°F | Wt 299.2 lb

## 2016-12-19 DIAGNOSIS — R21 Rash and other nonspecific skin eruption: Secondary | ICD-10-CM

## 2016-12-19 DIAGNOSIS — N529 Male erectile dysfunction, unspecified: Secondary | ICD-10-CM

## 2016-12-19 DIAGNOSIS — R1031 Right lower quadrant pain: Secondary | ICD-10-CM | POA: Diagnosis not present

## 2016-12-19 DIAGNOSIS — I10 Essential (primary) hypertension: Secondary | ICD-10-CM

## 2016-12-19 LAB — POC URINALSYSI DIPSTICK (AUTOMATED)
Bilirubin, UA: NEGATIVE
Blood, UA: NEGATIVE
GLUCOSE UA: NEGATIVE
Ketones, UA: NEGATIVE
LEUKOCYTES UA: NEGATIVE
NITRITE UA: NEGATIVE
PH UA: 6 (ref 5.0–8.0)
PROTEIN UA: NEGATIVE
Spec Grav, UA: >=1.03 — AB (ref 1.010–1.025)
Urobilinogen, UA: 0.2 E.U./dL

## 2016-12-19 MED ORDER — IOPAMIDOL (ISOVUE-300) INJECTION 61%
100.0000 mL | Freq: Once | INTRAVENOUS | Status: AC | PRN
  Administered 2016-12-19: 100 mL via INTRAVENOUS

## 2016-12-19 MED ORDER — LISINOPRIL-HYDROCHLOROTHIAZIDE 20-25 MG PO TABS: 0.5000 | ORAL_TABLET | Freq: Every day | ORAL | 3 refills | Status: DC

## 2016-12-19 MED ORDER — SULFAMETHOXAZOLE-TRIMETHOPRIM 800-160 MG PO TABS: 1.0000 | ORAL_TABLET | Freq: Two times a day (BID) | ORAL | 0 refills | Status: DC

## 2016-12-19 MED ORDER — SILDENAFIL CITRATE 100 MG PO TABS: 50.0000 mg | ORAL_TABLET | Freq: Every day | ORAL | 4 refills | Status: DC | PRN

## 2016-12-19 NOTE — Patient Instructions (Addendum)
Try lotrimin twice daily for 3 weeks to spots on back, let me know if not improved with this. See Shirlee LimerickMarion to schedule CT scan for today. We will be in touch with results.

## 2016-12-19 NOTE — Addendum Note (Signed)
Addended by: Nanci PinaGOINS, Zacherie Honeyman on: 12/19/2016 02:06 PM   Modules accepted: Orders

## 2016-12-19 NOTE — Assessment & Plan Note (Signed)
Possible tinea corporis - rec lotrimin OTC.

## 2016-12-19 NOTE — Progress Notes (Signed)
BP 136/88 (BP Location: Right Arm, Cuff Size: Large)   Pulse 87   Temp 98.1 F (36.7 C) (Oral)   Wt 299 lb 4 oz (135.7 kg)   SpO2 97%   BMI 40.59 kg/m    CC: R side pain Subjective:    Patient ID: Jorge Armstrong, male    DOB: 07/30/1970, 46 y.o.   MRN: [id number]  HPI: Jorge Armstrong is a 46 y.o. male presenting on 12/19/2016 for Flank Pain (right side. Has had constant dull pain under right chest about 1 yr. On 12/16/16 pain worsened and radiates down to right side. Tried ibuprofen, helpful.  Went ED but left before being seen. Had blood and urine tests)   1 yr h/o RUQ abdominal pain described as dull nagging constant pain. No known aggravating or alleviating factors. Acutely worse Monday night with radiation to R flank - describes sharp stabbing pain with bending at work. He also had nausea, diaphoresis, lightheaded with this. Sunday night he did have fried chicken.   No fevers/chills, vomiting, diarrhea or constipation, blood in stool or urine. No GERD, gassiness or indigestion or bloating. No epigastric pain.   Seen at ER 12/16/2016 - left without being seen. Labs, UA reviewed. UA WNL, Cr 1.4 (his baseline), WBC 13 elevated. LFTs WNL.   Has been treating pain with ibuporfen 400mg  BID.   Rash on back - itchy after shower.   Relevant past medical, surgical, family and social history reviewed and updated as indicated. Interim medical history since our last visit reviewed. Allergies and medications reviewed and updated. Outpatient Medications Prior to Visit  Medication Sig Dispense Refill  . lisinopril-hydrochlorothiazide (PRINZIDE,ZESTORETIC) 20-25 MG tablet Take 0.5 tablets by mouth daily. 45 tablet 3  . sildenafil (VIAGRA) 100 MG tablet Take 0.5-1 tablets (50-100 mg total) by mouth daily as needed for erectile dysfunction. 5 tablet 4   No facility-administered medications prior to visit.      Per HPI unless specifically indicated in ROS section below Review of Systems       Objective:    BP 136/88 (BP Location: Right Arm, Cuff Size: Large)   Pulse 87   Temp 98.1 F (36.7 C) (Oral)   Wt 299 lb 4 oz (135.7 kg)   SpO2 97%   BMI 40.59 kg/m   Wt Readings from Last 3 Encounters:  12/19/16 299 lb 4 oz (135.7 kg)  11/30/15 (!) 301 lb 8 oz (136.8 kg)  10/26/15 (!) 315 lb 4 oz (143 kg)    Physical Exam  Constitutional: He appears well-developed and well-nourished. No distress.  HENT:  Mouth/Throat: Oropharynx is clear and moist. No oropharyngeal exudate.  Eyes: Pupils are equal, round, and reactive to light. Conjunctivae are normal. No scleral icterus.  Neck: Normal range of motion. Neck supple.  Cardiovascular: Normal rate, regular rhythm, normal heart sounds and intact distal pulses.   No murmur heard. Pulmonary/Chest: Effort normal and breath sounds normal. No respiratory distress. He has no wheezes. He has no rales.  Abdominal: Soft. Normal appearance and bowel sounds are normal. He exhibits no distension and no mass. There is tenderness in the right upper quadrant and right lower quadrant. There is guarding. There is no rigidity, no rebound, no CVA tenderness and negative Murphy's sign.  Moderate RUQ pain with marked pain to palpation at RLQ associated with some guarding, I also feel palpable tender subcutaneous nodules under skin lateral and RLQ - reproducible 8/10 pain with palpation.   Musculoskeletal: He exhibits no edema.  Lymphadenopathy:    He has no cervical adenopathy.  Skin: Skin is warm and dry. Rash noted.  Hyperpigmented scaly macules midline upper back  Psychiatric: He has a normal mood and affect.  Nursing note and vitals reviewed.  Results for orders placed or performed during the hospital encounter of 12/16/16  Urinalysis, Routine w reflex microscopic- may I&O cath if menses  Result Value Ref Range   Color, Urine YELLOW YELLOW   APPearance CLEAR CLEAR   Specific Gravity, Urine 1.018 1.005 - 1.030   pH 7.0 5.0 - 8.0   Glucose, UA  NEGATIVE NEGATIVE mg/dL   Hgb urine dipstick NEGATIVE NEGATIVE   Bilirubin Urine NEGATIVE NEGATIVE   Ketones, ur NEGATIVE NEGATIVE mg/dL   Protein, ur NEGATIVE NEGATIVE mg/dL   Nitrite NEGATIVE NEGATIVE   Leukocytes, UA NEGATIVE NEGATIVE  Lipase, blood  Result Value Ref Range   Lipase 20 11 - 51 U/L  Comprehensive metabolic panel  Result Value Ref Range   Sodium 138 135 - 145 mmol/L   Potassium 3.6 3.5 - 5.1 mmol/L   Chloride 100 (L) 101 - 111 mmol/L   CO2 26 22 - 32 mmol/L   Glucose, Bld 102 (H) 65 - 99 mg/dL   BUN 16 6 - 20 mg/dL   Creatinine, Ser 1.61 (H) 0.61 - 1.24 mg/dL   Calcium 9.5 8.9 - 09.6 mg/dL   Total Protein 7.5 6.5 - 8.1 g/dL   Albumin 3.8 3.5 - 5.0 g/dL   AST 20 15 - 41 U/L   ALT 19 17 - 63 U/L   Alkaline Phosphatase 55 38 - 126 U/L   Total Bilirubin 0.7 0.3 - 1.2 mg/dL   GFR calc non Af Amer 58 (L) >60 mL/min   GFR calc Af Amer >60 >60 mL/min   Anion gap 12 5 - 15  CBC  Result Value Ref Range   WBC 13.0 (H) 4.0 - 10.5 K/uL   RBC 5.13 4.22 - 5.81 MIL/uL   Hemoglobin 14.3 13.0 - 17.0 g/dL   HCT 04.5 40.9 - 81.1 %   MCV 84.2 78.0 - 100.0 fL   MCH 27.9 26.0 - 34.0 pg   MCHC 33.1 30.0 - 36.0 g/dL   RDW 91.4 78.2 - 95.6 %   Platelets 251 150 - 400 K/uL   Rpt UA today WNL    Assessment & Plan:   Problem List Items Addressed This Visit    Erectile dysfunction    viagra refilled per pt request - tolerating well      HTN (hypertension)    ACEI/HCTZ refilled per patient request.       Relevant Medications   lisinopril-hydrochlorothiazide (PRINZIDE,ZESTORETIC) 20-25 MG tablet   sildenafil (VIAGRA) 100 MG tablet   Rash    Possible tinea corporis - rec lotrimin OTC.       RLQ abdominal pain - Primary    Longstanding pain but acutely worse this week.  On exam there is some guarding along with reproducible RLQ abdominal pain and some tender nodularity of subcutaneous tissue of lateral abdomen. ?panniculitis, r/o appendicitis or cholelithiasis. I will  check CT abd/pelvis with contrast to further evaluate for above. F/u pending results. Pt agrees with plan.       Relevant Orders   CT Abdomen Pelvis W Contrast       Follow up plan: Return if symptoms worsen or fail to improve.  Eustaquio Boyden, MD

## 2016-12-19 NOTE — Assessment & Plan Note (Signed)
viagra refilled per pt request - tolerating well

## 2016-12-19 NOTE — Assessment & Plan Note (Signed)
Longstanding pain but acutely worse this week.  On exam there is some guarding along with reproducible RLQ abdominal pain and some tender nodularity of subcutaneous tissue of lateral abdomen. ?panniculitis, r/o appendicitis or cholelithiasis. I will check CT abd/pelvis with contrast to further evaluate for above. F/u pending results. Pt agrees with plan.

## 2016-12-19 NOTE — Assessment & Plan Note (Signed)
ACEI/HCTZ refilled per patient request.

## 2017-05-09 ENCOUNTER — Ambulatory Visit (INDEPENDENT_AMBULATORY_CARE_PROVIDER_SITE_OTHER): Payer: BLUE CROSS/BLUE SHIELD | Admitting: Family Medicine

## 2017-05-09 ENCOUNTER — Encounter: Payer: Self-pay | Admitting: Family Medicine

## 2017-05-09 VITALS — BP 130/84 | HR 78 | Temp 98.8°F | Wt 293.0 lb

## 2017-05-09 DIAGNOSIS — N529 Male erectile dysfunction, unspecified: Secondary | ICD-10-CM | POA: Diagnosis not present

## 2017-05-09 DIAGNOSIS — R21 Rash and other nonspecific skin eruption: Secondary | ICD-10-CM

## 2017-05-09 MED ORDER — TRIAMCINOLONE ACETONIDE 0.1 % EX CREA: 1.0000 "application " | TOPICAL_CREAM | Freq: Two times a day (BID) | CUTANEOUS | 1 refills | Status: DC

## 2017-05-09 MED ORDER — SILDENAFIL CITRATE 100 MG PO TABS: 50.0000 mg | ORAL_TABLET | Freq: Every day | ORAL | 6 refills | Status: DC | PRN

## 2017-05-09 NOTE — Assessment & Plan Note (Signed)
Refilled per patient request - working adequately.

## 2017-05-09 NOTE — Assessment & Plan Note (Signed)
Did not improve with antifungal.  ?eczematous reaction.  Will Rx steroid cream trial - discussed how to use, discussed possible side effects so caution with overuse.  If worsening on steroid, stop and let me know. If not improved after treatment, update us consider derm eval.  Encouraged regular moisturizing after shower.

## 2017-05-09 NOTE — Progress Notes (Signed)
BP 130/84 (BP Location: Left Arm, Patient Position: Sitting, Cuff Size: Large)   Pulse 78   Temp 98.8 F (37.1 C) (Oral)   Wt 293 lb (132.9 kg)   SpO2 97%   BMI 39.74 kg/m    CC: rash Subjective:    Patient ID: Jorge Armstrong, male    DOB: 1970/11/17, 47 y.o.   MRN: [id number]  HPI: Jorge Armstrong is a 47 y.o. male presenting on 05/09/2017 for Rash (Located on back. Was seen in 11/2016 for rash and recommended Lotrimin OTC. Pt tried but not helpful.)   Ongoing rash for several months, some days itchy some days no concerns.  Denies fevers, oral lesions, no other rash.  Denies new lotions, detergents, soaps or shampoos.  No new foods. No new medicines.   Seen 11/2016 with rash to back, possible tinea corporis so we recommended OTC lotrimin - he has taken daily for last several months, no significant noted improvement. May have improved with change in shampoo to less fragrant, sensitive type.   Relevant past medical, surgical, family and social history reviewed and updated as indicated. Interim medical history since our last visit reviewed. Allergies and medications reviewed and updated. Outpatient Medications Prior to Visit  Medication Sig Dispense Refill  . lisinopril-hydrochlorothiazide (PRINZIDE,ZESTORETIC) 20-25 MG tablet Take 0.5 tablets by mouth daily. 45 tablet 3  . sildenafil (VIAGRA) 100 MG tablet Take 0.5-1 tablets (50-100 mg total) by mouth daily as needed for erectile dysfunction. 5 tablet 4  . sulfamethoxazole-trimethoprim (BACTRIM DS,SEPTRA DS) 800-160 MG tablet Take 1 tablet by mouth 2 (two) times daily. 14 tablet 0   No facility-administered medications prior to visit.      Per HPI unless specifically indicated in ROS section below Review of Systems     Objective:    BP 130/84 (BP Location: Left Arm, Patient Position: Sitting, Cuff Size: Large)   Pulse 78   Temp 98.8 F (37.1 C) (Oral)   Wt 293 lb (132.9 kg)   SpO2 97%   BMI 39.74 kg/m   Wt Readings from  Last 3 Encounters:  05/09/17 293 lb (132.9 kg)  12/19/16 299 lb 4 oz (135.7 kg)  11/30/15 (!) 301 lb 8 oz (136.8 kg)    Physical Exam  Constitutional: He appears well-developed and well-nourished. No distress.  Skin: Skin is warm and dry. Rash noted. No erythema.  Hyperpigmented macules upper mid back - 3 larger ones, 2 smaller ones No significant scaling today  Nursing note and vitals reviewed.     Assessment & Plan:   Problem List Items Addressed This Visit    Erectile dysfunction    Refilled per patient request - working adequately.      Rash - Primary    Did not improve with antifungal.  ?eczematous reaction.  Will Rx steroid cream trial - discussed how to use, discussed possible side effects so caution with overuse.  If worsening on steroid, stop and let me know. If not improved after treatment, update [place] consider derm eval.  Encouraged regular moisturizing after shower.           Meds ordered this encounter  Medications  . triamcinolone cream (KENALOG) 0.1 %    Sig: Apply 1 application topically 2 (two) times daily. Apply to AA for 2 weeks at a time    Dispense:  60 g    Refill:  1  . sildenafil (VIAGRA) 100 MG tablet    Sig: Take 0.5-1 tablets (50-100 mg total) by mouth daily  as needed for erectile dysfunction.    Dispense:  6 tablet    Refill:  6   No orders of the defined types were placed in this encounter.   Follow up plan: Return if symptoms worsen or fail to improve.  Eustaquio BoydenJavier Sayeed Weatherall, MD

## 2017-05-09 NOTE — Patient Instructions (Signed)
Possible eczema type rash.  Try new steroid cream sent in. Use twice daily to affected areas for 2 weeks then stop. Start using moisturizer after shower (like aveeno or eucerin).  If rash is worsening on steroid, stop and let me know.

## 2017-11-29 ENCOUNTER — Encounter (HOSPITAL_COMMUNITY): Payer: Self-pay | Admitting: *Deleted

## 2017-11-29 ENCOUNTER — Ambulatory Visit (HOSPITAL_COMMUNITY)
Admission: EM | Admit: 2017-11-29 | Discharge: 2017-11-29 | Disposition: A | Discharge status: Home / Self Care | Payer: 59 | Attending: Family Medicine | Admitting: Family Medicine

## 2017-11-29 DIAGNOSIS — M170 Bilateral primary osteoarthritis of knee: Secondary | ICD-10-CM

## 2017-11-29 DIAGNOSIS — M25561 Pain in right knee: Secondary | ICD-10-CM | POA: Diagnosis not present

## 2017-11-29 DIAGNOSIS — M25469 Effusion, unspecified knee: Secondary | ICD-10-CM

## 2017-11-29 MED ORDER — DICLOFENAC SODIUM 75 MG PO TBEC: 75.0000 mg | DELAYED_RELEASE_TABLET | Freq: Two times a day (BID) | ORAL | 0 refills | Status: DC

## 2017-11-29 MED ORDER — TRIAMCINOLONE ACETONIDE 40 MG/ML IJ SUSP
INTRAMUSCULAR | Status: AC
  Filled 2017-11-29: qty 1

## 2017-11-29 NOTE — ED Provider Notes (Signed)
[facility]    CSN: [id number] Arrival date & time: 11/29/17  1212     History   Chief Complaint Chief Complaint  Patient presents with  . Knee Pain    HPI Jorge Armstrong is a 47 y.o. male   history of arthritis, hypertension, hyperlipidemia presenting today for evaluation of right knee pain.  Patient states that over the past 3 days he has had worsening pain in his right knee.  Note is that he has arthritis in both knees.  Is on his feet for most of the day with work.  Denies any specific injury.  States that a few years ago he had draining of his knee and steroid injection which improved his symptoms significantly.  Denies any injections since, has not seen orthopedics.  He has not been taking any anti-inflammatories or medicines by mouth for his symptoms.  He wears Ace wraps and ice.   HPI  Past Medical History:  Diagnosis Date  . Arthritis    bilateral knees  . History of chicken pox   . HLD (hyperlipidemia)   . HTN (hypertension)   . Morbid obesity (HCC)   . Seasonal allergies    spring    Patient Active Problem List   Diagnosis Date Noted  . RLQ abdominal pain 12/19/2016  . Rash 12/19/2016  . Erectile dysfunction 10/26/2015  . Right knee pain 10/10/2014  . HTN (hypertension)   . HLD (hyperlipidemia)   . Seasonal allergies   . Obesity, Class III, BMI 40-49.9 (morbid obesity) (HCC)     Past Surgical History:  Procedure Laterality Date  . SHOULDER SURGERY Right 1990       Home Medications    Prior to Admission medications   Medication Sig Start Date End Date Taking? Authorizing Provider  lisinopril-hydrochlorothiazide (PRINZIDE,ZESTORETIC) 20-25 MG tablet Take 0.5 tablets by mouth daily. 12/19/16  Yes Eustaquio Boyden, MD  diclofenac (VOLTAREN) 75 MG EC tablet Take 1 tablet (75 mg total) by mouth 2 (two) times daily. 11/29/17   Wendell Nicoson C, PA-C  sildenafil (VIAGRA) 100 MG tablet Take 0.5-1 tablets (50-100 mg total) by mouth daily as  needed for erectile dysfunction. 05/09/17   Eustaquio Boyden, MD    Family History Family History  Problem Relation Age of Onset  . Hypertension Mother   . Cancer Mother        breast  . CAD Neg Hx   . Stroke Neg Hx   . Diabetes Neg Hx     Social History Social History   Tobacco Use  . Smoking status: Never Smoker  . Smokeless tobacco: Never Used  Substance Use Topics  . Alcohol use: Yes    Comment: Regular (2-3 glasses wine nightly)  . Drug use: No     Allergies   Patient has no known allergies.   Review of Systems Review of Systems  Constitutional: Negative for fatigue and fever.  Eyes: Negative for redness, itching and visual disturbance.  Respiratory: Negative for shortness of breath.   Cardiovascular: Negative for chest pain and leg swelling.  Gastrointestinal: Negative for nausea and vomiting.  Musculoskeletal: Positive for arthralgias, joint swelling and myalgias.  Skin: Negative for color change, rash and wound.  Neurological: Negative for dizziness, syncope, weakness, light-headedness and headaches.     Physical Exam Triage Vital Signs ED Triage Vitals  Enc Vitals Group     BP 11/29/17 1314 (!) 143/101     Pulse Rate 11/29/17 1314 75     Resp 11/29/17  1314 18     Temp 11/29/17 1314 97.7 F (36.5 C)     Temp Source 11/29/17 1314 Oral     SpO2 11/29/17 1314 100 %     Weight --      Height --      Head Circumference --      Peak Flow --      Pain Score 11/29/17 1316 8     Pain Loc --      Pain Edu? --      Excl. in GC? --    No data found.  Updated Vital Signs BP (!) 143/101 Comment: sts did not take HTN med this AM  Pulse 75   Temp 97.7 F (36.5 C) (Oral)   Resp 18   SpO2 100%   Visual Acuity Right Eye Distance:   Left Eye Distance:   Bilateral Distance:    Right Eye Near:   Left Eye Near:    Bilateral Near:     Physical Exam  Constitutional: He is oriented to person, place, and time. He appears well-developed and  well-nourished.  No acute distress  HENT:  Head: Normocephalic and atraumatic.  Nose: Nose normal.  Eyes: Conjunctivae are normal.  Neck: Neck supple.  Cardiovascular: Normal rate.  Pulmonary/Chest: Effort normal. No respiratory distress.  Abdominal: He exhibits no distension.  Musculoskeletal: Normal range of motion.  Palpable crepitus below the patella of bilateral knees, mild swelling to right knee, no obvious effusion, no overlying erythema, no focal tenderness.  Full active range of motion.  Neurological: He is alert and oriented to person, place, and time.  Skin: Skin is warm and dry.  Psychiatric: He has a normal mood and affect.  Nursing note and vitals reviewed.    UC Treatments / Results  Labs (all labs ordered are listed, but only abnormal results are displayed) Labs Reviewed - No data to display  EKG None  Radiology No results found.  Procedures Join Aspiration/Injection Date/Time: 11/29/2017 2:19 PM Performed by: Depaul Arizpe, Junius Creamer, PA-C Authorized by: Sharlene Dory, DO   Consent:    Consent obtained:  Verbal   Consent given by:  Patient   Risks discussed:  Bleeding, infection, pain and incomplete drainage   Alternatives discussed:  No treatment and alternative treatment Location:    Location:  Knee   Knee:  R knee Anesthesia (see MAR for exact dosages):    Anesthesia method:  Topical application   Topical anesthesia: lidocaine wheal. Procedure details:    Preparation: Patient was prepped and draped in usual sterile fashion     Needle gauge:  18 G   Ultrasound guidance: no     Approach: Superior lateral.   Aspirate amount:  < 1 cc   Aspirate characteristics: Small amount of yellow fluid.   Steroid injected: yes     Specimen collected: no   Post-procedure details:    Dressing:  Adhesive bandage   Patient tolerance of procedure:  Tolerated well, no immediate complications Comments:     Kenalog 40 mg per 1 mL   (including critical care  time)  Medications Ordered in UC Medications - No data to display  Initial Impression / Assessment and Plan / UC Course  I have reviewed the triage vital signs and the nursing notes.  Pertinent labs & imaging results that were available during my care of the patient were reviewed by me and considered in my medical decision making (see chart for details).     No immediate  complications; knee injected, provided diclofenac to use for further anti-inflammatory, ice and elevation, Ace wrap.  Follow-up if developing signs of infection in the knee, worsening symptoms.  Discussed strict return precautions. Patient verbalized understanding and is agreeable with plan.  Final Clinical Impressions(s) / UC Diagnoses   Final diagnoses:  Acute pain of right knee     Discharge Instructions     We injected your knee today Occasionally symptoms can worsen initially after injections and then should have improvement in 1-2 days Take anti-inflammatories, Use anti-inflammatories for pain/swelling. You may take up to 800 mg Ibuprofen every 8 hours with food. You may supplement Ibuprofen with Tylenol [phone] mg every 8 hours. OR diclofenac twice daily; dont take this with ibuprofen Ice and elevate knee  Follow up if knee having increased redness, swelling, pain, difficulty moving knee   ED Prescriptions    Medication Sig Dispense Auth. Provider   diclofenac (VOLTAREN) 75 MG EC tablet Take 1 tablet (75 mg total) by mouth 2 (two) times daily. 30 tablet Othel Hoogendoorn, [place] C, PA-C     Controlled Substance Prescriptions [place] Controlled Substance Registry consulted? Not Applicable   Lew Dawes, [place] 11/29/17 1422

## 2017-11-29 NOTE — Discharge Instructions (Signed)
We injected your knee today Occasionally symptoms can worsen initially after injections and then should have improvement in 1-2 days Take anti-inflammatories, Use anti-inflammatories for pain/swelling. You may take up to 800 mg Ibuprofen every 8 hours with food. You may supplement Ibuprofen with Tylenol [phone] mg every 8 hours. OR diclofenac twice daily; dont take this with ibuprofen Ice and elevate knee  Follow up if knee having increased redness, swelling, pain, difficulty moving knee

## 2017-11-29 NOTE — ED Triage Notes (Signed)
Denies injury.  C/O right knee pain x 3 days; "I always have trouble with it".

## 2017-12-23 ENCOUNTER — Encounter: Payer: Self-pay | Admitting: Family Medicine

## 2017-12-24 NOTE — Telephone Encounter (Signed)
Last office visit 05/09/2017 for Rash.  Last refilled 11/29/2017 for #30 with no refills by [facility] PA-C.  No future appointments.  Refill?

## 2017-12-26 MED ORDER — DICLOFENAC SODIUM 75 MG PO TBEC: 75.0000 mg | DELAYED_RELEASE_TABLET | Freq: Two times a day (BID) | ORAL | 0 refills | Status: DC

## 2018-02-10 ENCOUNTER — Other Ambulatory Visit: Payer: Self-pay | Admitting: Family Medicine

## 2018-02-14 ENCOUNTER — Other Ambulatory Visit: Payer: Self-pay | Admitting: Family Medicine

## 2018-02-16 NOTE — Telephone Encounter (Signed)
Electronic refill request Diclofenac Last refill 12/26/17 #30 Last office visit 05/09/17

## 2018-04-05 ENCOUNTER — Other Ambulatory Visit: Payer: Self-pay | Admitting: Family Medicine

## 2018-05-22 ENCOUNTER — Other Ambulatory Visit: Payer: Self-pay | Admitting: Family Medicine

## 2018-05-22 NOTE — Telephone Encounter (Signed)
Voltaren tab Last filled:  04/05/18, #30 Last OV:  05/09/17, acute Next OV:  none

## 2018-07-09 ENCOUNTER — Other Ambulatory Visit: Payer: Self-pay | Admitting: Family Medicine

## 2018-07-17 ENCOUNTER — Ambulatory Visit (INDEPENDENT_AMBULATORY_CARE_PROVIDER_SITE_OTHER): Payer: 59 | Admitting: Family Medicine

## 2018-07-17 ENCOUNTER — Other Ambulatory Visit: Payer: Self-pay

## 2018-07-17 ENCOUNTER — Encounter: Payer: Self-pay | Admitting: Family Medicine

## 2018-07-17 VITALS — BP 138/86 | HR 92 | Temp 98.2°F | Ht 71.0 in | Wt 309.6 lb

## 2018-07-17 DIAGNOSIS — G8929 Other chronic pain: Secondary | ICD-10-CM | POA: Diagnosis not present

## 2018-07-17 DIAGNOSIS — M25561 Pain in right knee: Secondary | ICD-10-CM | POA: Diagnosis not present

## 2018-07-17 MED ORDER — DICLOFENAC SODIUM 75 MG PO TBEC: 75.0000 mg | DELAYED_RELEASE_TABLET | Freq: Every day | ORAL | 6 refills | Status: DC

## 2018-07-17 NOTE — Assessment & Plan Note (Signed)
Discussed relation of weight to osteoarthritis pain. Encouraged ongoing efforts at weight loss through healthy diet changes. Activity limited by knee pain.

## 2018-07-17 NOTE — Assessment & Plan Note (Signed)
Ongoing R knee pain since at least 2015, acute flare after he ran out of diclofenac (started 11/2017). Anticipate degenerative arthritis and possible meniscal injury. Will refill diclofenac. Discussed further eval by ortho - he is interested. Referral placed today.

## 2018-07-17 NOTE — Progress Notes (Signed)
This visit was conducted in person.  BP 138/86 (BP Location: Left Arm, Patient Position: Sitting, Cuff Size: Large)   Pulse 92   Temp 98.2 F (36.8 C) (Oral)   Ht 5\' 11"  (1.803 m)   Wt (!) 309 lb 9 oz (140.4 kg)   SpO2 94%   BMI 43.18 kg/m    CC: R leg pain Subjective:    Patient ID: Jorge Armstrong, male    DOB: 08-27-1970, 48 y.o.   MRN: [id number]  HPI: Jorge Armstrong is a 48 y.o. male presenting on 07/17/2018 for Leg Pain (C/o right leg pain located in lower posterior thigh. Started about 2 wks ago after running out of diclofenac. )   Known R knee arthritis for years, has been taking diclofenac 75mg  once daily for knee pain. Ran out a few weeks ago, noted R posterior knee pain started at that time.   He has received steroid injection into R knee latest 11/2017.  Last R knee xray 2016 with mild DJD changes  Denies inciting trauma/injury.  No redness or swelling of knee.  He has started using fish oil joint supplement over last 2 weeks.   No personal or family history of blood clots.  No prolonged periods of immobility.      Relevant past medical, surgical, family and social history reviewed and updated as indicated. Interim medical history since our last visit reviewed. Allergies and medications reviewed and updated. Outpatient Medications Prior to Visit  Medication Sig Dispense Refill  . GLUCOSAMINE HCL PO Take by mouth daily.    Marland Kitchen. lisinopril-hydrochlorothiazide (PRINZIDE,ZESTORETIC) 20-25 MG tablet TAKE 0.5 TABLETS BY MOUTH DAILY. 45 tablet 1  . Omega-3 Fatty Acids (FISH OIL PO) Take by mouth daily.    . diclofenac (VOLTAREN) 75 MG EC tablet TAKE 1 TABLET BY MOUTH TWICE A DAY 30 tablet 0  . sildenafil (VIAGRA) 100 MG tablet Take 0.5-1 tablets (50-100 mg total) by mouth daily as needed for erectile dysfunction. 6 tablet 6   No facility-administered medications prior to visit.      Per HPI unless specifically indicated in ROS section below Review of Systems  Objective:    BP 138/86 (BP Location: Left Arm, Patient Position: Sitting, Cuff Size: Large)   Pulse 92   Temp 98.2 F (36.8 C) (Oral)   Ht 5\' 11"  (1.803 m)   Wt (!) 309 lb 9 oz (140.4 kg)   SpO2 94%   BMI 43.18 kg/m   Wt Readings from Last 3 Encounters:  07/17/18 (!) 309 lb 9 oz (140.4 kg)  05/09/17 293 lb (132.9 kg)  12/19/16 299 lb 4 oz (135.7 kg)    Physical Exam Vitals signs and nursing note reviewed.  Constitutional:      Appearance: Normal appearance. He is not ill-appearing.  Musculoskeletal: Normal range of motion.        General: Swelling present.     Right lower leg: No edema.     Left lower leg: No edema.     Comments:  L knee WNL R Knee exam: No deformity on inspection. Tender with palpation of posterior knee + effusion present  FROM in flex/extension with tenderness and crepitus. + popliteal fullness. Neg drawer test. Tender with mcmurray test. No pain with valgus/varus stress. No PFgrind. No abnormal patellar mobility.   Neurological:     Mental Status: He is alert.        Assessment & Plan:   Problem List Items Addressed This Visit    Right knee  pain - Primary    Ongoing R knee pain since at least 2015, acute flare after he ran out of diclofenac (started 11/2017). Anticipate degenerative arthritis and possible meniscal injury. Will refill diclofenac. Discussed further eval by ortho - he is interested. Referral placed today.       Relevant Orders   Ambulatory referral to Orthopedic Surgery   Obesity, Class III, BMI 40-49.9 (morbid obesity) (HCC)    Discussed relation of weight to osteoarthritis pain. Encouraged ongoing efforts at weight loss through healthy diet changes. Activity limited by knee pain.           Meds ordered this encounter  Medications  . diclofenac (VOLTAREN) 75 MG EC tablet    Sig: Take 1 tablet (75 mg total) by mouth daily.    Dispense:  30 tablet    Refill:  6   Orders Placed This Encounter  Procedures  .  Ambulatory referral to Orthopedic Surgery    Referral Priority:   Routine    Referral Type:   Surgical    Referral Reason:   Specialty Services Required    Requested Specialty:   Orthopedic Surgery    Number of Visits Requested:   1    Follow up plan: No follow-ups on file.  Eustaquio Boyden, MD

## 2018-07-17 NOTE — Patient Instructions (Signed)
I do think this is flare of knee arthritis and you may have baker's cyst on the back of the knee We will refer you to orthopedist for further evaluation.

## 2018-09-02 ENCOUNTER — Other Ambulatory Visit: Payer: Self-pay | Admitting: Family Medicine

## 2018-11-13 ENCOUNTER — Encounter: Payer: Self-pay | Admitting: Family Medicine

## 2018-11-13 MED ORDER — LISINOPRIL-HYDROCHLOROTHIAZIDE 20-25 MG PO TABS: 0.5000 | ORAL_TABLET | Freq: Every day | ORAL | 0 refills | Status: DC

## 2018-11-13 NOTE — Telephone Encounter (Signed)
Pls schedule CPE and labs.  Then return message to me for refill.

## 2018-11-13 NOTE — Telephone Encounter (Addendum)
Noted.  E-scribed refill. 

## 2018-11-13 NOTE — Addendum Note (Signed)
Addended by: Brenton Grills on: 2/97/9892 11:94 PM   Modules accepted: Orders

## 2018-11-13 NOTE — Telephone Encounter (Signed)
Pt scheduled for 01/11/19 @ 4pm with same day labs per patient. Pt is aware to fast for 4 hours prior to appt.

## 2019-01-11 ENCOUNTER — Other Ambulatory Visit: Payer: Self-pay

## 2019-01-11 ENCOUNTER — Encounter: Payer: Self-pay | Admitting: Family Medicine

## 2019-01-11 ENCOUNTER — Ambulatory Visit (INDEPENDENT_AMBULATORY_CARE_PROVIDER_SITE_OTHER): Payer: 59 | Admitting: Family Medicine

## 2019-01-11 VITALS — BP 134/82 | HR 96 | Temp 98.0°F | Ht 70.0 in | Wt 314.4 lb

## 2019-01-11 DIAGNOSIS — E785 Hyperlipidemia, unspecified: Secondary | ICD-10-CM | POA: Diagnosis not present

## 2019-01-11 DIAGNOSIS — Z1211 Encounter for screening for malignant neoplasm of colon: Secondary | ICD-10-CM

## 2019-01-11 DIAGNOSIS — Z0000 Encounter for general adult medical examination without abnormal findings: Secondary | ICD-10-CM | POA: Insufficient documentation

## 2019-01-11 DIAGNOSIS — Z23 Encounter for immunization: Secondary | ICD-10-CM

## 2019-01-11 DIAGNOSIS — Z125 Encounter for screening for malignant neoplasm of prostate: Secondary | ICD-10-CM

## 2019-01-11 DIAGNOSIS — I10 Essential (primary) hypertension: Secondary | ICD-10-CM

## 2019-01-11 MED ORDER — LISINOPRIL-HYDROCHLOROTHIAZIDE 10-12.5 MG PO TABS: 1.0000 | ORAL_TABLET | Freq: Every day | ORAL | 3 refills | Status: DC

## 2019-01-11 NOTE — Assessment & Plan Note (Signed)
Chronic, update FLP off meds. The ASCVD Risk score Mikey Bussing DC Jr., et al., 2013) failed to calculate for the following reasons:   Cannot find a previous HDL lab   Cannot find a previous total cholesterol lab

## 2019-01-11 NOTE — Assessment & Plan Note (Signed)
Chronic, stable. Continue current regimen. 

## 2019-01-11 NOTE — Progress Notes (Signed)
This visit was conducted in person.  BP 134/82 (BP Location: Left Arm, Patient Position: Sitting, Cuff Size: Large)   Pulse 96   Temp 98 F (36.7 C) (Temporal)   Ht _0  (1.778 m)   Wt (!) 314 lb 6 oz (142.6 kg)   SpO2 97%   BMI 45.11 kg/m    CC: CPE Subjective:    Patient ID: Jorge Armstrong, male    DOB: Jul 04, 1970, 48 y.o.   MRN: [id number]  HPI: Jorge Armstrong is a 48 y.o. male presenting on 01/11/2019 for Annual Exam   Preventative: Colon cancer screening - discussed, would like to iFOB this year Prostate cancer screening - discussed, would like PSA and defer DRE Influenza yearly Tdap 2014 Seat belt use discussed Sunscreen use discussed. No changing moles on skin Non smoker Alcohol - 2 glasses of wine/night Dentist overdue - last saw 3 yrs ago Eye exam yearly  Lives with wife and son (in college), 1 dog  Occ: Oxygen tank filler, part time job on weekends  Edu: bachelor's  Activity: walking, has exercise bike Diet: good water, fruits/vegetables some     Relevant past medical, surgical, family and social history reviewed and updated as indicated. Interim medical history since our last visit reviewed. Allergies and medications reviewed and updated. Outpatient Medications Prior to Visit  Medication Sig Dispense Refill  . diclofenac (VOLTAREN) 75 MG EC tablet Take 1 tablet (75 mg total) by mouth daily. 30 tablet 6  . diclofenac Sodium (VOLTAREN) 1 % GEL Apply topically as needed. OTC    . GLUCOSAMINE HCL PO Take by mouth daily.    Marland Kitchen OVER THE COUNTER MEDICATION Apply topically as needed. CBD topical    . lisinopril-hydrochlorothiazide (ZESTORETIC) 20-25 MG tablet Take 0.5 tablets by mouth daily. 45 tablet 0  . Omega-3 Fatty Acids (FISH OIL PO) Take by mouth daily.     No facility-administered medications prior to visit.      Per HPI unless specifically indicated in ROS section below Review of Systems  Constitutional: Negative for activity change, appetite  change, chills, fatigue, fever and unexpected weight change.  HENT: Negative for hearing loss.   Eyes: Negative for visual disturbance.  Respiratory: Negative for cough, chest tightness, shortness of breath and wheezing.   Cardiovascular: Negative for chest pain, palpitations and leg swelling.  Gastrointestinal: Negative for abdominal distention, abdominal pain, blood in stool, constipation, diarrhea, nausea and vomiting.  Genitourinary: Negative for difficulty urinating and hematuria.  Musculoskeletal: Negative for arthralgias, myalgias and neck pain.  Skin: Negative for rash.  Neurological: Negative for dizziness, seizures, syncope and headaches.  Hematological: Negative for adenopathy. Does not bruise/bleed easily.  Psychiatric/Behavioral: Negative for dysphoric mood. The patient is not nervous/anxious.    Objective:    BP 134/82 (BP Location: Left Arm, Patient Position: Sitting, Cuff Size: Large)   Pulse 96   Temp 98 F (36.7 C) (Temporal)   Ht _1  (1.778 m)   Wt (!) 314 lb 6 oz (142.6 kg)   SpO2 97%   BMI 45.11 kg/m   Wt Readings from Last 3 Encounters:  01/11/19 (!) 314 lb 6 oz (142.6 kg)  07/17/18 (!) 309 lb 9 oz (140.4 kg)  05/09/17 293 lb (132.9 kg)    Physical Exam Vitals signs and nursing note reviewed.  Constitutional:      General: He is not in acute distress.    Appearance: Normal appearance. He is well-developed. He is obese. He is not ill-appearing.  HENT:  Head: Normocephalic and atraumatic.     Right Ear: Hearing, tympanic membrane, ear canal and external ear normal.     Left Ear: Hearing, tympanic membrane, ear canal and external ear normal.     Nose: Nose normal.     Mouth/Throat:     Mouth: Mucous membranes are moist.     Pharynx: Oropharynx is clear. Uvula midline. No posterior oropharyngeal erythema.  Eyes:     General: No scleral icterus.    Extraocular Movements: Extraocular movements intact.     Conjunctiva/sclera: Conjunctivae normal.      Pupils: Pupils are equal, round, and reactive to light.  Neck:     Musculoskeletal: Normal range of motion and neck supple.  Cardiovascular:     Rate and Rhythm: Normal rate and regular rhythm.     Pulses: Normal pulses.          Radial pulses are 2+ on the right side and 2+ on the left side.     Heart sounds: Normal heart sounds. No murmur.  Pulmonary:     Effort: Pulmonary effort is normal. No respiratory distress.     Breath sounds: Normal breath sounds. No wheezing, rhonchi or rales.  Abdominal:     General: Bowel sounds are normal. There is no distension.     Palpations: Abdomen is soft. There is no mass.     Tenderness: There is no abdominal tenderness. There is no guarding or rebound.     Hernia: No hernia is present.  Musculoskeletal: Normal range of motion.     Right lower leg: No edema.     Left lower leg: No edema.  Lymphadenopathy:     Cervical: No cervical adenopathy.  Skin:    General: Skin is warm and dry.     Findings: No rash.  Neurological:     General: No focal deficit present.     Mental Status: He is alert and oriented to person, place, and time.     Comments: CN grossly intact, station and gait intact  Psychiatric:        Mood and Affect: Mood normal.        Behavior: Behavior normal.        Thought Content: Thought content normal.        Judgment: Judgment normal.       Results for orders placed or performed in visit on 12/19/16  POCT Urinalysis Dipstick (Automated)  Result Value Ref Range   Color, UA yellow    Clarity, UA clear    Glucose, UA negative    Bilirubin, UA negative    Ketones, UA negative    Spec Grav, UA >=1.030 (A) 1.010 - 1.025   Blood, UA negative    pH, UA 6.0 5.0 - 8.0   Protein, UA negative    Urobilinogen, UA 0.2 0.2 or 1.0 E.U./dL   Nitrite, UA negative    Leukocytes, UA Negative Negative   Assessment & Plan:   Problem List Items Addressed This Visit    Obesity, Class III, BMI 40-49.9 (morbid obesity) ([place])     Encouraged healthy diet and lifestyle changes to affect sustainable weight loss. Reviewed healthy food choices, reviewed example of exercise routine. Obesity contributes to chronic medical conditions.      HTN (hypertension)    Chronic, stable. Continue current regimen.       Relevant Medications   lisinopril-hydrochlorothiazide (ZESTORETIC) 10-12.5 MG tablet   HLD (hyperlipidemia)    Chronic, update FLP off meds. The ASCVD Risk  score Mikey Bussing DC Jr., et al., 2013) failed to calculate for the following reasons:   Cannot find a previous HDL lab   Cannot find a previous total cholesterol lab       Relevant Medications   lisinopril-hydrochlorothiazide (ZESTORETIC) 10-12.5 MG tablet   Other Relevant Orders   Lipid panel   Comprehensive metabolic panel   Health maintenance examination - Primary    Preventative protocols reviewed and updated unless pt declined. Discussed healthy diet and lifestyle.        Other Visit Diagnoses    Need for influenza vaccination       Relevant Orders   Flu Vaccine QUAD 36+ mos IM (Completed)   Special screening for malignant neoplasms, colon       Relevant Orders   Fecal occult blood, imunochemical   Special screening for malignant neoplasm of prostate       Relevant Orders   PSA       Meds ordered this encounter  Medications  . lisinopril-hydrochlorothiazide (ZESTORETIC) 10-12.5 MG tablet    Sig: Take 1 tablet by mouth daily.    Dispense:  90 tablet    Refill:  3   Orders Placed This Encounter  Procedures  . Fecal occult blood, imunochemical    Standing Status:   Future    Standing Expiration Date:   01/11/2020  . Flu Vaccine QUAD 36+ mos IM  . Lipid panel  . Comprehensive metabolic panel  . PSA    Patient instructions: Flu shot today.  Pass by lab to pick up stool kit. Labs today.  Schedule dentist appointment at your convenience.  Work on regular exercise routine for goal sustainable weight loss.  Return as needed or in 1 year  for next physical.   Follow up plan: Return in about 1 year (around 01/11/2020) for annual exam, prior fasting for blood work.  Ria Bush, MD

## 2019-01-11 NOTE — Patient Instructions (Addendum)
Flu shot today.  Pass by lab to pick up stool kit. Labs today.  Schedule dentist appointment at your convenience.  Work on regular exercise routine for goal sustainable weight loss.  Return as needed or in 1 year for next physical.   Health Maintenance, Male Adopting a healthy lifestyle and getting preventive care are important in promoting health and wellness. Ask your health care provider about:  The right schedule for you to have regular tests and exams.  Things you can do on your own to prevent diseases and keep yourself healthy. What should I know about diet, weight, and exercise? Eat a healthy diet   Eat a diet that includes plenty of vegetables, fruits, low-fat dairy products, and lean protein.  Do not eat a lot of foods that are high in solid fats, added sugars, or sodium. Maintain a healthy weight Body mass index (BMI) is a measurement that can be used to identify possible weight problems. It estimates body fat based on height and weight. Your health care provider can help determine your BMI and help you achieve or maintain a healthy weight. Get regular exercise Get regular exercise. This is one of the most important things you can do for your health. Most adults should:  Exercise for at least 150 minutes each week. The exercise should increase your heart rate and make you sweat (moderate-intensity exercise).  Do strengthening exercises at least twice a week. This is in addition to the moderate-intensity exercise.  Spend less time sitting. Even light physical activity can be beneficial. Watch cholesterol and blood lipids Have your blood tested for lipids and cholesterol at 50 years of age, then have this test every 5 years. You may need to have your cholesterol levels checked more often if:  Your lipid or cholesterol levels are high.  You are older than 50 years of age.  You are at high risk for heart disease. What should I know about cancer screening? Many types of  cancers can be detected early and may often be prevented. Depending on your health history and family history, you may need to have cancer screening at various ages. This may include screening for:  Colorectal cancer.  Prostate cancer.  Skin cancer.  Lung cancer. What should I know about heart disease, diabetes, and high blood pressure? Blood pressure and heart disease  High blood pressure causes heart disease and increases the risk of stroke. This is more likely to develop in people who have high blood pressure readings, are of African descent, or are overweight.  Talk with your health care provider about your target blood pressure readings.  Have your blood pressure checked: ? Every 3-5 years if you are 27-20 years of age. ? Every year if you are 39 years old or older.  If you are between the ages of 67 and 53 and are a current or former smoker, ask your health care provider if you should have a one-time screening for abdominal aortic aneurysm (AAA). Diabetes Have regular diabetes screenings. This checks your fasting blood sugar level. Have the screening done:  Once every three years after age 27 if you are at a normal weight and have a low risk for diabetes.  More often and at a younger age if you are overweight or have a high risk for diabetes. What should I know about preventing infection? Hepatitis B If you have a higher risk for hepatitis B, you should be screened for this virus. Talk with your health care provider to  find out if you are at risk for hepatitis B infection. Hepatitis C Blood testing is recommended for:  Everyone born from [birth year] through 1967.  Anyone with known risk factors for hepatitis C. Sexually transmitted infections (STIs)  You should be screened each year for STIs, including gonorrhea and chlamydia, if: ? You are sexually active and are younger than 50 years of age. ? You are older than 50 years of age and your health care provider tells you that you  are at risk for this type of infection. ? Your sexual activity has changed since you were last screened, and you are at increased risk for chlamydia or gonorrhea. Ask your health care provider if you are at risk.  Ask your health care provider about whether you are at high risk for HIV. Your health care provider may recommend a prescription medicine to help prevent HIV infection. If you choose to take medicine to prevent HIV, you should first get tested for HIV. You should then be tested every 3 months for as long as you are taking the medicine. Follow these instructions at home: Lifestyle  Do not use any products that contain nicotine or tobacco, such as cigarettes, e-cigarettes, and chewing tobacco. If you need help quitting, ask your health care provider.  Do not use street drugs.  Do not share needles.  Ask your health care provider for help if you need support or information about quitting drugs. Alcohol use  Do not drink alcohol if your health care provider tells you not to drink.  If you drink alcohol: ? Limit how much you have to 0-2 drinks a day. ? Be aware of how much alcohol is in your drink. In the [place] one drink equals one 12 oz bottle of beer (355 mL), one 5 oz glass of wine (148 mL), or one 1 oz glass of hard liquor (44 mL). General instructions  Schedule regular health, dental, and eye exams.  Stay current with your vaccines.  Tell your health care provider if: ? You often feel depressed. ? You have ever been abused or do not feel safe at home. Summary  Adopting a healthy lifestyle and getting preventive care are important in promoting health and wellness.  Follow your health care provider's instructions about healthy diet, exercising, and getting tested or screened for diseases.  Follow your health care provider's instructions on monitoring your cholesterol and blood pressure. This information is not intended to replace advice given to you by your health care  provider. Make sure you discuss any questions you have with your health care provider. Document Released: 08/10/2007 Document Revised: 02/04/2018 Document Reviewed: 02/04/2018 Elsevier Patient Education  2020 [employer].

## 2019-01-11 NOTE — Assessment & Plan Note (Signed)
Preventative protocols reviewed and updated unless pt declined. Discussed healthy diet and lifestyle.  

## 2019-01-11 NOTE — Assessment & Plan Note (Signed)
Encouraged healthy diet and lifestyle changes to affect sustainable weight loss. Reviewed healthy food choices, reviewed example of exercise routine. Obesity contributes to chronic medical conditions.

## 2019-01-12 LAB — COMPREHENSIVE METABOLIC PANEL
ALT: 16 U/L (ref 0–53)
AST: 18 U/L (ref 0–37)
Albumin: 4.2 g/dL (ref 3.5–5.2)
Alkaline Phosphatase: 54 U/L (ref 39–117)
BUN: 18 mg/dL (ref 6–23)
CO2: 27 mEq/L (ref 19–32)
Calcium: 9.7 mg/dL (ref 8.4–10.5)
Chloride: 101 mEq/L (ref 96–112)
Creatinine, Ser: 1.19 mg/dL (ref 0.40–1.50)
GFR: 78.08 mL/min (ref >60.00)
Glucose, Bld: 90 mg/dL (ref 70–99)
Potassium: 3.7 mEq/L (ref 3.5–5.1)
Sodium: 137 mEq/L (ref 135–145)
Total Bilirubin: 0.8 mg/dL (ref 0.2–1.2)
Total Protein: 7.3 g/dL (ref 6.0–8.3)

## 2019-01-12 LAB — LIPID PANEL
Cholesterol: 223 mg/dL — ABNORMAL HIGH (ref 0–200)
HDL: 53.9 mg/dL (ref >39.00)
LDL Cholesterol: 151 mg/dL — ABNORMAL HIGH (ref 0–99)
NonHDL: 169.51
Total CHOL/HDL Ratio: 4
Triglycerides: 95 mg/dL (ref 0.0–149.0)
VLDL: 19 mg/dL (ref 0.0–40.0)

## 2019-01-12 LAB — PSA: PSA: 0.22 ng/mL (ref 0.10–4.00)

## 2019-01-14 ENCOUNTER — Ambulatory Visit: Payer: 59

## 2019-02-09 ENCOUNTER — Other Ambulatory Visit: Payer: Self-pay | Admitting: Family Medicine

## 2019-03-08 ENCOUNTER — Other Ambulatory Visit: Payer: Self-pay | Admitting: Family Medicine

## 2019-03-08 NOTE — Telephone Encounter (Signed)
Diclofenac tab Last filled:  02/02/19, #30 Last OV:  01/11/19, CPE Next OV:  none

## 2019-03-23 ENCOUNTER — Telehealth: Payer: Self-pay

## 2019-03-23 NOTE — Telephone Encounter (Signed)
bp was good 12/2018 at CPE. rec OV for recheck and to fill form if appropriate.  Have him monitor BPs at home and bring log between now and appt.

## 2019-03-23 NOTE — Telephone Encounter (Signed)
Pt was seen at [facility] today for pre-employment exam. His BP was 160/90. They gave him a form for his PCP to fill out with new BP Readings. He is asking does he need an OV or just a Nurse Visit. Please advise pt.

## 2019-03-24 ENCOUNTER — Ambulatory Visit (INDEPENDENT_AMBULATORY_CARE_PROVIDER_SITE_OTHER): Payer: 59 | Admitting: Family Medicine

## 2019-03-24 ENCOUNTER — Encounter: Payer: Self-pay | Admitting: Family Medicine

## 2019-03-24 ENCOUNTER — Other Ambulatory Visit: Payer: Self-pay

## 2019-03-24 VITALS — BP 144/94 | HR 101 | Temp 98.5°F | Ht 70.0 in | Wt 311.2 lb

## 2019-03-24 DIAGNOSIS — I10 Essential (primary) hypertension: Secondary | ICD-10-CM

## 2019-03-24 MED ORDER — METOPROLOL SUCCINATE ER 25 MG PO TB24: 25.0000 mg | ORAL_TABLET | Freq: Every day | ORAL | 3 refills | Status: DC

## 2019-03-24 NOTE — Progress Notes (Signed)
This visit was conducted in person.  BP (!) 144/94 (BP Location: Left Arm, Cuff Size: Large)   Pulse (!) 101   Temp 98.5 F (36.9 C) (Temporal)   Ht 5\' 10"  (1.778 m)   Wt (!) 311 lb 3 oz (141.2 kg)   SpO2 97%   BMI 44.65 kg/m   BP Readings from Last 3 Encounters:  03/24/19 (!) 144/94  01/11/19 134/82  07/17/18 138/86    CC: BP check Subjective:    Patient ID: 07/[id number], male    DOB: [birth year], 49 y.o.   MRN: [id number]  HPI: Arris Meyn is a 49 y.o. male presenting on 03/24/2019 for Hypertension (Had elevated BP readings at pre-employemt appt yesterday.  Told to see PCP and have form completed.)   See yesterday's phone note.  Seen at [facility] health for pre-employment physical, BP noted to be 160/90. Advised to return to PCP for HTN eval and clearance for work.   HTN - Compliant with current antihypertensive regimen of lisinopril hctz 20/25mg  daily. Does not check blood pressures at home but has bp cuff. No recent increased salt in diet. No diet changes. No low blood pressure readings or symptoms of dizziness/syncope. Denies HA, vision changes, CP/tightness, SOB, leg swelling.       Relevant past medical, surgical, family and social history reviewed and updated as indicated. Interim medical history since our last visit reviewed. Allergies and medications reviewed and updated. Outpatient Medications Prior to Visit  Medication Sig Dispense Refill  . diclofenac (VOLTAREN) 75 MG EC tablet TAKE 1 TABLET BY MOUTH EVERY DAY 30 tablet 6  . diclofenac Sodium (VOLTAREN) 1 % GEL Apply topically as needed. OTC    . GLUCOSAMINE HCL PO Take by mouth daily.    [facility] OVER THE COUNTER MEDICATION Apply topically as needed. CBD topical    . lisinopril-hydrochlorothiazide (ZESTORETIC) 10-12.5 MG tablet Take 1 tablet by mouth daily. 90 tablet 3   No facility-administered medications prior to visit.     Per HPI unless specifically indicated in ROS section below Review of Systems  Objective:    BP (!) 144/94 (BP Location: Left Arm, Cuff Size: Large)   Pulse (!) 101   Temp 98.5 F (36.9 C) (Temporal)   Ht 5\' 10"  (1.778 m)   Wt (!) 311 lb 3 oz (141.2 kg)   SpO2 97%   BMI 44.65 kg/m   Wt Readings from Last 3 Encounters:  03/24/19 (!) 311 lb 3 oz (141.2 kg)  01/11/19 (!) 314 lb 6 oz (142.6 kg)  07/17/18 (!) 309 lb 9 oz (140.4 kg)    Physical Exam Vitals and nursing note reviewed.  Constitutional:      Appearance: Normal appearance. He is obese. He is not ill-appearing.  Cardiovascular:     Rate and Rhythm: Normal rate.     Pulses: Normal pulses.     Heart sounds: Normal heart sounds. No murmur.  Pulmonary:     Effort: Pulmonary effort is normal. No respiratory distress.     Breath sounds: Normal breath sounds. No wheezing, rhonchi or rales.  Neurological:     Mental Status: He is alert.  Psychiatric:        Mood and Affect: Mood normal.        Behavior: Behavior normal.       Assessment & Plan:  This visit occurred during the SARS-CoV-2 public health emergency.  Safety protocols were in place, including screening questions prior to the visit, additional usage of staff  PPE, and extensive cleaning of exam room while observing appropriate contact time as indicated for disinfecting solutions.   Problem List Items Addressed This Visit    HTN (hypertension) - Primary    Deteriorated control - anticipate stress and rushing to get to appt yesterday contributed to elevated readings - but staying high along with fast HR today - will continue lisinopril hctz 10/12.5mg  daily and add toprol XL 25mg  daily. Also reviewed importance of weight loss and good hydration status to help control pulse. RTC 4-6 wks f/u visit. Pre employment form filled out today.       Relevant Medications   metoprolol succinate (TOPROL-XL) 25 MG 24 hr tablet       Meds ordered this encounter  Medications  . metoprolol succinate (TOPROL-XL) 25 MG 24 hr tablet    Sig: Take 1 tablet (25  mg total) by mouth daily.    Dispense:  90 tablet    Refill:  3   No orders of the defined types were placed in this encounter.   Patient Instructions  Pulse and blood pressures are staying a bit elevated - continue lisinopril hctz 10/12.5mg  daily (same medicine), add metoprolol (toprol XL) 25mg  daily to regimen. This will help with both blood pressure and pulse.  Increase water, work on weight - both these things can help control blood pressure and pulse as well.  Form for work filled out.  Return in 4-6 weeks for BP / pulse check.    Follow up plan: Return in about 4 weeks (around 04/21/2019), or if symptoms worsen or fail to improve.  Ria Bush, MD

## 2019-03-24 NOTE — Telephone Encounter (Signed)
Pt scheduled today at 3:00.

## 2019-03-24 NOTE — Patient Instructions (Addendum)
Pulse and blood pressures are staying a bit elevated - continue lisinopril hctz 10/12.5mg  daily (same medicine), add metoprolol (toprol XL) 25mg  daily to regimen. This will help with both blood pressure and pulse.  Increase water, work on weight - both these things can help control blood pressure and pulse as well.  Form for work filled out.  Return in 4-6 weeks for BP / pulse check.

## 2019-03-24 NOTE — Assessment & Plan Note (Signed)
Deteriorated control - anticipate stress and rushing to get to appt yesterday contributed to elevated readings - but staying high along with fast HR today - will continue lisinopril hctz 10/12.5mg  daily and add toprol XL 25mg  daily. Also reviewed importance of weight loss and good hydration status to help control pulse. RTC 4-6 wks f/u visit. Pre employment form filled out today.

## 2019-04-02 ENCOUNTER — Ambulatory Visit (INDEPENDENT_AMBULATORY_CARE_PROVIDER_SITE_OTHER): Payer: 59 | Admitting: Family Medicine

## 2019-04-02 ENCOUNTER — Other Ambulatory Visit: Payer: Self-pay

## 2019-04-02 ENCOUNTER — Encounter: Payer: Self-pay | Admitting: Family Medicine

## 2019-04-02 VITALS — BP 140/90 | HR 83 | Temp 98.0°F | Ht 70.0 in | Wt 311.3 lb

## 2019-04-02 DIAGNOSIS — R0600 Dyspnea, unspecified: Secondary | ICD-10-CM | POA: Diagnosis not present

## 2019-04-02 DIAGNOSIS — I10 Essential (primary) hypertension: Secondary | ICD-10-CM | POA: Diagnosis not present

## 2019-04-02 DIAGNOSIS — R0609 Other forms of dyspnea: Secondary | ICD-10-CM | POA: Insufficient documentation

## 2019-04-02 LAB — BASIC METABOLIC PANEL
BUN: 14 mg/dL (ref 6–23)
CO2: 29 mEq/L (ref 19–32)
Calcium: 9.7 mg/dL (ref 8.4–10.5)
Chloride: 102 mEq/L (ref 96–112)
Creatinine, Ser: 1.14 mg/dL (ref 0.40–1.50)
GFR: 81.98 mL/min (ref >60.00)
Glucose, Bld: 93 mg/dL (ref 70–99)
Potassium: 4.2 mEq/L (ref 3.5–5.1)
Sodium: 139 mEq/L (ref 135–145)

## 2019-04-02 LAB — CBC WITH DIFFERENTIAL/PLATELET
Basophils Absolute: 0 10*3/uL (ref 0.0–0.1)
Basophils Relative: 0.5 % (ref 0.0–3.0)
Eosinophils Absolute: 0.1 10*3/uL (ref 0.0–0.7)
Eosinophils Relative: 1.8 % (ref 0.0–5.0)
HCT: 43.3 % (ref 39.0–52.0)
Hemoglobin: 13.9 g/dL (ref 13.0–17.0)
Lymphocytes Relative: 21.1 % (ref 12.0–46.0)
Lymphs Abs: 1.7 10*3/uL (ref 0.7–4.0)
MCHC: 32 g/dL (ref 30.0–36.0)
MCV: 83.3 fl (ref 78.0–100.0)
Monocytes Absolute: 0.7 10*3/uL (ref 0.1–1.0)
Monocytes Relative: 9.1 % (ref 3.0–12.0)
Neutro Abs: 5.4 10*3/uL (ref 1.4–7.7)
Neutrophils Relative %: 67.5 % (ref 43.0–77.0)
Platelets: 342 10*3/uL (ref 150.0–400.0)
RBC: 5.2 Mil/uL (ref 4.22–5.81)
RDW: 13.7 % (ref 11.5–15.5)
WBC: 8.1 10*3/uL (ref 4.0–10.5)

## 2019-04-02 LAB — TSH: TSH: 0.45 u[IU]/mL (ref 0.35–4.50)

## 2019-04-02 LAB — BRAIN NATRIURETIC PEPTIDE: Pro B Natriuretic peptide (BNP): 19 pg/mL (ref 0.0–100.0)

## 2019-04-02 MED ORDER — LISINOPRIL-HYDROCHLOROTHIAZIDE 20-12.5 MG PO TABS: 1.0000 | ORAL_TABLET | Freq: Every day | ORAL | 3 refills | Status: DC

## 2019-04-02 NOTE — Progress Notes (Signed)
This visit was conducted in person.  BP 140/90 (BP Location: Left Arm, Patient Position: Sitting, Cuff Size: Large)   Pulse 83   Temp 98 F (36.7 C) (Temporal)   Ht 5\' 10"  (1.778 m)   Wt (!) 311 lb 5 oz (141.2 kg)   SpO2 99%   BMI 44.67 kg/m   On recheck, 144/88  CC: fill out forms Subjective:    Patient ID: Jorge Armstrong, male    DOB: 20-Jan-1971, 49 y.o.   MRN: [id number]  HPI: Jorge Armstrong is a 49 y.o. male presenting on 04/02/2019 for Form Completion (Needs pre-employment forms completed.  Job needs to know if pt can wear a respirator.)   See prior note for details - last visit we added toprol XL 25mg  to his lisinopril hctz 10/12.5mg  daily.  Home BP readings 140-150s/80-104, HR 70-80s.   States he needs work accomodation form filled out - as he failed pre employment physical 03/23/2019.  Was unable to complete respirator test due to marked dyspnea - was unable to go up and down flight of stairs x6 (only 3) so was unable to complete respirator test. Denies chest pain or dizziness with this.   Snores but no PNdyspnea, witnessed apneic episodes, daytime sleepiness.      Relevant past medical, surgical, family and social history reviewed and updated as indicated. Interim medical history since our last visit reviewed. Allergies and medications reviewed and updated. Outpatient Medications Prior to Visit  Medication Sig Dispense Refill  . diclofenac (VOLTAREN) 75 MG EC tablet TAKE 1 TABLET BY MOUTH EVERY DAY 30 tablet 6  . diclofenac Sodium (VOLTAREN) 1 % GEL Apply topically as needed. OTC    . GLUCOSAMINE HCL PO Take by mouth daily.    . metoprolol succinate (TOPROL-XL) 25 MG 24 hr tablet Take 1 tablet (25 mg total) by mouth daily. 90 tablet 3  . OVER THE COUNTER MEDICATION Apply topically as needed. CBD topical     No facility-administered medications prior to visit.     Per HPI unless specifically indicated in ROS section below Review of Systems Objective:    BP 140/90  (BP Location: Left Arm, Patient Position: Sitting, Cuff Size: Large)   Pulse 83   Temp 98 F (36.7 C) (Temporal)   Ht 5\' 10"  (1.778 m)   Wt (!) 311 lb 5 oz (141.2 kg)   SpO2 99%   BMI 44.67 kg/m   Wt Readings from Last 3 Encounters:  04/02/19 (!) 311 lb 5 oz (141.2 kg)  03/24/19 (!) 311 lb 3 oz (141.2 kg)  01/11/19 (!) 314 lb 6 oz (142.6 kg)    Physical Exam Vitals and nursing note reviewed.  Constitutional:      Appearance: Normal appearance. He is obese. He is not ill-appearing.  Cardiovascular:     Rate and Rhythm: Normal rate and regular rhythm.     Pulses: Normal pulses.     Heart sounds: Normal heart sounds. No murmur.  Pulmonary:     Effort: Pulmonary effort is normal. No respiratory distress.     Breath sounds: Normal breath sounds. No wheezing, rhonchi or rales.  Musculoskeletal:     Right lower leg: No edema.     Left lower leg: No edema.  Neurological:     Mental Status: He is alert.  Psychiatric:        Mood and Affect: Mood normal.        Behavior: Behavior normal.       Results for  orders placed or performed in visit on 01/11/19  Lipid panel  Result Value Ref Range   Cholesterol 223 (H) 0 - 200 mg/dL   Triglycerides 84.1 0.0 - 149.0 mg/dL   HDL 32.44 >01.02 mg/dL   VLDL 72.5 0.0 - 36.6 mg/dL   LDL Cholesterol 440 (H) 0 - 99 mg/dL   Total CHOL/HDL Ratio 4    NonHDL 169.51   Comprehensive metabolic panel  Result Value Ref Range   Sodium 137 135 - 145 mEq/L   Potassium 3.7 3.5 - 5.1 mEq/L   Chloride 101 96 - 112 mEq/L   CO2 27 19 - 32 mEq/L   Glucose, Bld 90 70 - 99 mg/dL   BUN 18 6 - 23 mg/dL   Creatinine, Ser 3.47 0.40 - 1.50 mg/dL   Total Bilirubin 0.8 0.2 - 1.2 mg/dL   Alkaline Phosphatase 54 39 - 117 U/L   AST 18 0 - 37 U/L   ALT 16 0 - 53 U/L   Total Protein 7.3 6.0 - 8.3 g/dL   Albumin 4.2 3.5 - 5.2 g/dL   GFR 42.59 >56.38 mL/min   Calcium 9.7 8.4 - 10.5 mg/dL  PSA  Result Value Ref Range   PSA 0.22 0.10 - 4.00 ng/mL   EKG - NSR  rate 75, normal axis, intervals, delayed R wave progression with nonspecific ST changes anteriorly, no acute ST/T changes, no old to compare Assessment & Plan:  This visit occurred during the SARS-CoV-2 public health emergency.  Safety protocols were in place, including screening questions prior to the visit, additional usage of staff PPE, and extensive cleaning of exam room while observing appropriate contact time as indicated for disinfecting solutions.   Problem List Items Addressed This Visit    Obesity, Class III, BMI 40-49.9 (morbid obesity) (HCC)    Encouraged weight loss to improve deconditioning Denies symptoms of OSA.      HTN (hypertension)    Staying high in office and at home - will increase lisinopril hctz to 20/12.5mg  daily, continue Toprol XL 25mg  daily.       Relevant Medications   lisinopril-hydrochlorothiazide (ZESTORETIC) 20-12.5 MG tablet   Exertional dyspnea - Primary    Failed pre employment physical - unable to complete physical activity and respirator testing - advised I can't fill out work accomodation saying he doesn't need respirator if he needs it for his job.  Will further evaluate exertional dyspnea with EKG, labs today. Denies OSA symptoms. Lungs clear today and no smoking or asthma history.       Relevant Orders   EKG 12-Lead (Completed)   TSH   CBC with Differential/Platelet   Basic metabolic panel   Brain natriuretic peptide       Meds ordered this encounter  Medications  . lisinopril-hydrochlorothiazide (ZESTORETIC) 20-12.5 MG tablet    Sig: Take 1 tablet by mouth daily.    Dispense:  90 tablet    Refill:  3    Note new dose   Orders Placed This Encounter  Procedures  . TSH  . CBC with Differential/Platelet  . Basic metabolic panel  . Brain natriuretic peptide  . EKG 12-Lead    Patient Instructions  Increase lisinopril hctz to 20/12.5mg  dose. EKG today Labs today Form filled out - unfortunately I cannot complete work accommodation  as I think you need respirator for safety.   Follow up plan: No follow-ups on file.  , MD

## 2019-04-02 NOTE — Assessment & Plan Note (Addendum)
Encouraged weight loss to improve deconditioning Denies symptoms of OSA.

## 2019-04-02 NOTE — Assessment & Plan Note (Addendum)
Failed pre employment physical - unable to complete physical activity and respirator testing - advised I can't fill out work accomodation saying he doesn't need respirator if he needs it for his job.  Will further evaluate exertional dyspnea with EKG, labs today. Denies OSA symptoms. Lungs clear today and no smoking or asthma history.

## 2019-04-02 NOTE — Assessment & Plan Note (Signed)
Staying high in office and at home - will increase lisinopril hctz to 20/12.5mg  daily, continue Toprol XL 25mg  daily.

## 2019-04-02 NOTE — Patient Instructions (Addendum)
Increase lisinopril hctz to 20/12.5mg  dose. EKG today Labs today Form filled out - unfortunately I cannot complete work accommodation as I think you need respirator for safety.

## 2019-06-14 ENCOUNTER — Other Ambulatory Visit: Payer: Self-pay

## 2019-06-14 ENCOUNTER — Encounter (HOSPITAL_COMMUNITY): Payer: Self-pay

## 2019-06-14 ENCOUNTER — Ambulatory Visit (HOSPITAL_COMMUNITY)
Admission: EM | Admit: 2019-06-14 | Discharge: 2019-06-14 | Disposition: A | Discharge status: Home / Self Care | Payer: 59 | Attending: Family Medicine | Admitting: Family Medicine

## 2019-06-14 DIAGNOSIS — M25461 Effusion, right knee: Secondary | ICD-10-CM | POA: Diagnosis not present

## 2019-06-14 MED ORDER — BUPIVACAINE HCL (PF) 0.5 % IJ SOLN
1.0000 mL | Freq: Once | INTRAMUSCULAR | Status: AC
  Administered 2019-06-14: 14:00:00 1 mL via INTRA_ARTICULAR

## 2019-06-14 MED ORDER — METHYLPREDNISOLONE ACETATE 80 MG/ML IJ SUSP
80.0000 mg | Freq: Once | INTRAMUSCULAR | Status: AC
  Administered 2019-06-14: 80 mg via INTRA_ARTICULAR

## 2019-06-14 NOTE — ED Triage Notes (Signed)
Pt c/o bilateral knee pain, denies injury.

## 2019-06-14 NOTE — ED Provider Notes (Signed)
[facility]    CSN: [id number] Arrival date & time: 06/14/19  1202      History   Chief Complaint Chief Complaint  Patient presents with  . Knee Pain    HPI Jorge Armstrong is a 49 y.o. male.   This is an established [facility] patient.  He is presenting with bilateral knee pain.  He has been diagnosed with osteoarthritis in the past.  Significantly, patient is a class III obese patient.     Past Medical History:  Diagnosis Date  . Arthritis    bilateral knees  . History of chicken pox   . HLD (hyperlipidemia)   . HTN (hypertension)   . Morbid obesity (HCC)   . Seasonal allergies    spring    Patient Active Problem List   Diagnosis Date Noted  . Exertional dyspnea 04/02/2019  . Health maintenance examination 01/11/2019  . Rash 12/19/2016  . Erectile dysfunction 10/26/2015  . Right knee pain 10/10/2014  . HTN (hypertension)   . HLD (hyperlipidemia)   . Seasonal allergies   . Obesity, Class III, BMI 40-49.9 (morbid obesity) (HCC)     Past Surgical History:  Procedure Laterality Date  . SHOULDER SURGERY Right 1990       Home Medications    Prior to Admission medications   Medication Sig Start Date End Date Taking? Authorizing Provider  diclofenac (VOLTAREN) 75 MG EC tablet TAKE 1 TABLET BY MOUTH EVERY DAY 03/09/19   Eustaquio Boyden, MD  diclofenac Sodium (VOLTAREN) 1 % GEL Apply topically as needed. OTC    [provider]  GLUCOSAMINE HCL PO Take by mouth daily.    [provider]  lisinopril-hydrochlorothiazide (ZESTORETIC) 20-12.5 MG tablet Take 1 tablet by mouth daily. 04/02/19   Eustaquio Boyden, MD  metoprolol succinate (TOPROL-XL) 25 MG 24 hr tablet Take 1 tablet (25 mg total) by mouth daily. 03/24/19   Eustaquio Boyden, MD  OVER THE COUNTER MEDICATION Apply topically as needed. CBD topical    [provider]    Family History Family History  Problem Relation Age of Onset  . Hypertension  Mother   . Cancer Mother        breast  . CAD Neg Hx   . Stroke Neg Hx   . Diabetes Neg Hx     Social History Social History   Tobacco Use  . Smoking status: Never Smoker  . Smokeless tobacco: Never Used  Substance Use Topics  . Alcohol use: Yes    Comment: Regular (2-3 glasses wine nightly)  . Drug use: No     Allergies   Patient has no known allergies.   Review of Systems Review of Systems   Physical Exam Triage Vital Signs ED Triage Vitals  Enc Vitals Group     BP 06/14/19 1251 (!) 185/108     Pulse Rate 06/14/19 1251 74     Resp 06/14/19 1251 16     Temp 06/14/19 1251 97.9 F (36.6 C)     Temp src --      SpO2 06/14/19 1251 100 %     Weight --      Height --      Head Circumference --      Peak Flow --      Pain Score 06/14/19 1252 7     Pain Loc --      Pain Edu? --      Excl. in GC? --    No  data found.  Updated Vital Signs BP (!) 185/108 Comment: Has not taken BP meds this AM  Pulse 74   Temp 97.9 F (36.6 C)   Resp 16   SpO2 100%    Physical Exam Vitals and nursing note reviewed.  Constitutional:      Appearance: Normal appearance.  HENT:     Head: Normocephalic.     Nose: Nose normal.  Pulmonary:     Effort: Pulmonary effort is normal.  Musculoskeletal:        General: Swelling, tenderness and signs of injury present.     Cervical back: Normal range of motion.     Comments: Right knee effusion with normal rom and intact ligaments  Skin:    General: Skin is warm and dry.  Neurological:     General: No focal deficit present.     Mental Status: He is alert.  Psychiatric:        Mood and Affect: Mood normal.      UC Treatments / Results  Labs (all labs ordered are listed, but only abnormal results are displayed) Labs Reviewed - No data to display  EKG   Radiology No results found.  Procedures Join Aspiration/Injection  Date/Time: 06/14/2019 1:31 PM Performed by: Robyn Haber, MD Authorized by: Robyn Haber, MD   Consent:    Consent obtained:  Verbal   Consent given by:  Patient   Risks discussed:  Pain   Alternatives discussed:  No treatment Location:    Location:  Knee   Knee:  R knee Anesthesia (see MAR for exact dosages):    Anesthesia method:  None Procedure details:    Preparation: Patient was prepped and draped in usual sterile fashion     Needle gauge:  22 G   Ultrasound guidance: no     Approach:  Lateral   Steroid injected: yes   Post-procedure details:    Patient tolerance of procedure:  Tolerated well, no immediate complications   (including critical care time)  Medications Ordered in UC Medications  bupivacaine (MARCAINE) 0.5 % injection 1 mL (has no administration in time range)  methylPREDNISolone acetate (DEPO-MEDROL) injection 80 mg (80 mg Intra-articular Given by Other 06/14/19 1332)    Initial Impression / Assessment and Plan / UC Course  I have reviewed the triage vital signs and the nursing notes.  Pertinent labs & imaging results that were available during my care of the patient were reviewed by me and considered in my medical decision making (see chart for details).    Final Clinical Impressions(s) / UC Diagnoses   Final diagnoses:  Effusion of right knee   Discharge Instructions   None    ED Prescriptions    None     I have reviewed the PDMP during this encounter.   Robyn Haber, MD 06/14/19 1332

## 2019-11-17 ENCOUNTER — Other Ambulatory Visit: Payer: Self-pay | Admitting: Family Medicine

## 2019-11-17 NOTE — Telephone Encounter (Signed)
Voltaren tab Last filled:  10/19/19, #30 Last OV:  04/02/19, exertional dyspnea Next OV:  none

## 2019-12-11 ENCOUNTER — Ambulatory Visit: Payer: 59 | Attending: Internal Medicine

## 2019-12-11 DIAGNOSIS — Z23 Encounter for immunization: Secondary | ICD-10-CM

## 2019-12-11 NOTE — Progress Notes (Signed)
   Covid-[facility] Vaccination [facility]  Name:  Taven Strite    MRN: [id number] DOB: 08/05/1970  12/11/2019  Mr. Spoelstra was observed post Covid-19 immunization for 15 minutes without incident. He was provided with Vaccine Information Sheet and instruction to access the V-Safe system.   Mr. Hermann was instructed to call 911 with any severe reactions post vaccine: Marland Kitchen Difficulty breathing  . Swelling of face and throat  . A fast heartbeat  . A bad rash all over body  . Dizziness and weakness

## 2019-12-31 ENCOUNTER — Ambulatory Visit (INDEPENDENT_AMBULATORY_CARE_PROVIDER_SITE_OTHER): Payer: 59

## 2019-12-31 ENCOUNTER — Other Ambulatory Visit: Payer: Self-pay

## 2019-12-31 ENCOUNTER — Ambulatory Visit (HOSPITAL_COMMUNITY)
Admission: EM | Admit: 2019-12-31 | Discharge: 2019-12-31 | Disposition: A | Discharge status: Home / Self Care | Payer: 59 | Attending: Family Medicine | Admitting: Family Medicine

## 2019-12-31 ENCOUNTER — Encounter (HOSPITAL_COMMUNITY): Payer: Self-pay | Admitting: *Deleted

## 2019-12-31 DIAGNOSIS — G8929 Other chronic pain: Secondary | ICD-10-CM

## 2019-12-31 DIAGNOSIS — M7989 Other specified soft tissue disorders: Secondary | ICD-10-CM | POA: Diagnosis not present

## 2019-12-31 DIAGNOSIS — M25561 Pain in right knee: Secondary | ICD-10-CM | POA: Diagnosis not present

## 2019-12-31 MED ORDER — METHYLPREDNISOLONE SODIUM SUCC 125 MG IJ SOLR
80.0000 mg | Freq: Once | INTRAMUSCULAR | Status: AC
  Administered 2019-12-31: 80 mg via INTRAMUSCULAR

## 2019-12-31 MED ORDER — METHYLPREDNISOLONE SODIUM SUCC 125 MG IJ SOLR
INTRAMUSCULAR | Status: AC
  Filled 2019-12-31: qty 2

## 2019-12-31 NOTE — Discharge Instructions (Signed)
No effusion seen on xray  Steroid injection given here.  You can continue the diclofenac as needed.  Rest, Ice, elevate and compression(ace wrap)  Recommend sports medicine for follow up.

## 2019-12-31 NOTE — ED Triage Notes (Signed)
Pt reports on going RT knee pain and swelling

## 2019-12-31 NOTE — ED Provider Notes (Signed)
[facility]    CSN: [id number] Arrival date & time: 12/31/19  0849      History   Chief Complaint Chief Complaint  Patient presents with  . Knee Pain    HPI Jorge Armstrong is a 49 y.o. male.   Patient is a 49 year old male who presents today with chronic arthritis in bilateral knees.  This flareup started a few days ago.  Reporting with the weather change.  Mild swelling and worsening pain to the right knee.  No injuries.  Has been taking his prescribed diclofenac.      Past Medical History:  Diagnosis Date  . Arthritis    bilateral knees  . History of chicken pox   . HLD (hyperlipidemia)   . HTN (hypertension)   . Morbid obesity (HCC)   . Seasonal allergies    spring    Patient Active Problem List   Diagnosis Date Noted  . Exertional dyspnea 04/02/2019  . Health maintenance examination 01/11/2019  . Rash 12/19/2016  . Erectile dysfunction 10/26/2015  . Right knee pain 10/10/2014  . HTN (hypertension)   . HLD (hyperlipidemia)   . Seasonal allergies   . Obesity, Class III, BMI 40-49.9 (morbid obesity) (HCC)     Past Surgical History:  Procedure Laterality Date  . SHOULDER SURGERY Right 1990       Home Medications    Prior to Admission medications   Medication Sig Start Date End Date Taking? Authorizing Provider  diclofenac Sodium (VOLTAREN) 1 % GEL Apply topically as needed. OTC   Yes [provider]  GLUCOSAMINE HCL PO Take by mouth daily.   Yes [provider]  lisinopril-hydrochlorothiazide (ZESTORETIC) 20-12.5 MG tablet Take 1 tablet by mouth daily. 04/02/19  Yes Eustaquio Boyden, MD  metoprolol succinate (TOPROL-XL) 25 MG 24 hr tablet Take 1 tablet (25 mg total) by mouth daily. 03/24/19  Yes Eustaquio Boyden, MD  diclofenac (VOLTAREN) 75 MG EC tablet TAKE 1 TABLET BY MOUTH EVERY DAY 11/17/19   Eustaquio Boyden, MD  OVER THE COUNTER MEDICATION Apply topically as needed. CBD topical    [provider]    Family  History Family History  Problem Relation Age of Onset  . Hypertension Mother   . Cancer Mother        breast  . CAD Neg Hx   . Stroke Neg Hx   . Diabetes Neg Hx     Social History Social History   Tobacco Use  . Smoking status: Never Smoker  . Smokeless tobacco: Never Used  Substance Use Topics  . Alcohol use: Yes    Comment: Regular (2-3 glasses wine nightly)  . Drug use: No     Allergies   Patient has no known allergies.   Review of Systems Review of Systems   Physical Exam Triage Vital Signs ED Triage Vitals  Enc Vitals Group     BP 12/31/19 0921 (!) 157/96     Pulse Rate 12/31/19 0921 82     Resp 12/31/19 0921 15     Temp 12/31/19 0921 98.3 F (36.8 C)     Temp Source 12/31/19 0921 Oral     SpO2 12/31/19 0921 100 %     Weight 12/31/19 0924 300 lb (136.1 kg)     Height 12/31/19 0924 5\' 11"  (1.803 m)     Head Circumference --      Peak Flow --      Pain Score 12/31/19 0924 6     Pain Loc --  Pain Edu? --      Excl. in GC? --    No data found.  Updated Vital Signs BP (!) 157/96 (BP Location: Left Arm)   Pulse 82   Temp 98.3 F (36.8 C) (Oral)   Resp 15   Ht 5\' 11"  (1.803 m)   Wt 300 lb (136.1 kg)   SpO2 100%   BMI 41.84 kg/m   Visual Acuity Right Eye Distance:   Left Eye Distance:   Bilateral Distance:    Right Eye Near:   Left Eye Near:    Bilateral Near:     Physical Exam Vitals and nursing note reviewed.  Constitutional:      Appearance: Normal appearance.  HENT:     Head: Normocephalic and atraumatic.  Eyes:     Conjunctiva/sclera: Conjunctivae normal.  Pulmonary:     Effort: Pulmonary effort is normal.  Musculoskeletal:        General: Normal range of motion.     Cervical back: Normal range of motion.     Comments: Generalized tenderness and mild swelling to right knee Normal range of motion  Skin:    General: Skin is warm and dry.  Neurological:     Mental Status: He is alert.  Psychiatric:        Mood and  Affect: Mood normal.      UC Treatments / Results  Labs (all labs ordered are listed, but only abnormal results are displayed) Labs Reviewed - No data to display  EKG   Radiology DG Knee Complete 4 Views Right  Result Date: 12/31/2019 CLINICAL DATA:  Right knee pain and swelling x 2 years. Intermittent pain but more with cold weather. Pain with walking and bending knee. Pain more inferior to knee cap. No previous fx or surgery.knee pain and swelling EXAM: RIGHT KNEE - COMPLETE 4+ VIEW COMPARISON:  Radiograph 07/23/2014 FINDINGS: No fracture of the proximal tibia or distal femur. Patella is normal. No joint effusion. Osteophytosis of the patella and superior margin of the femoral condyles. Mild lateral femoral condyle osteophytosis additionally. Joint space fairly well maintained. IMPRESSION: 1. No acute findings of the knee.  No joint effusion. 2. Osteophytosis progressed from 2016. Electronically Signed   By: 2017 M.D.   On: 12/31/2019 10:12    Procedures Procedures (including critical care time)  Medications Ordered in UC Medications  methylPREDNISolone sodium succinate (SOLU-MEDROL) 125 mg/2 mL injection 80 mg (has no administration in time range)    Initial Impression / Assessment and Plan / UC Course  I have reviewed the triage vital signs and the nursing notes.  Pertinent labs & imaging results that were available during my care of the patient were reviewed by me and considered in my medical decision making (see chart for details).     Chronic knee pain No effusion on x-ray today.  No indication for joint aspiration Will give steroid injection here today He can continue the diclofenac Rest, Ice, elevate, ace wrap Recommended follow with specialist for any continued issues. Final Clinical Impressions(s) / UC Diagnoses   Final diagnoses:  Chronic pain of right knee     Discharge Instructions     No effusion seen on xray  Steroid injection given here.   You can continue the diclofenac as needed.  Rest, Ice, elevate and compression(ace wrap)  Recommend sports medicine for follow up.     ED Prescriptions    None     PDMP not reviewed this encounter.   13/06/2019,  Ngoc Daughtridge A, NP 12/31/19 1038

## 2020-03-17 ENCOUNTER — Telehealth: Payer: Self-pay | Admitting: Family Medicine

## 2020-03-17 NOTE — Telephone Encounter (Signed)
E-scribed refill.  Plz schedule labs and cpe.  

## 2020-03-22 NOTE — Telephone Encounter (Signed)
Called and left vm for the patient to call and schedule CPE and labs. EM 03/22/20

## 2020-03-22 NOTE — Telephone Encounter (Signed)
Noted  

## 2020-03-23 ENCOUNTER — Encounter: Payer: Self-pay | Admitting: Family Medicine

## 2020-03-23 NOTE — Telephone Encounter (Signed)
Called a second time to schedule CPE and labs left vm to call our office. Will send a letter EM

## 2020-04-19 ENCOUNTER — Telehealth: Payer: Self-pay | Admitting: Family Medicine

## 2020-04-19 NOTE — Telephone Encounter (Signed)
Patient has been contacted and left a detailed message to call and schedule cpe and labs. EM

## 2020-04-19 NOTE — Telephone Encounter (Signed)
E-scribed refill.  Plz schedule lab and cpe visits.  

## 2020-04-19 NOTE — Telephone Encounter (Signed)
Noted  

## 2020-04-26 NOTE — Telephone Encounter (Signed)
Called and left second message for patient. Will mail letter to patient Jorge Armstrong

## 2020-04-26 NOTE — Telephone Encounter (Signed)
Noted  

## 2020-04-30 ENCOUNTER — Other Ambulatory Visit: Payer: Self-pay

## 2020-04-30 ENCOUNTER — Ambulatory Visit (INDEPENDENT_AMBULATORY_CARE_PROVIDER_SITE_OTHER): Payer: Self-pay

## 2020-04-30 ENCOUNTER — Ambulatory Visit
Admission: EM | Admit: 2020-04-30 | Discharge: 2020-04-30 | Disposition: A | Discharge status: Home / Self Care | Payer: 59 | Attending: Emergency Medicine | Admitting: Emergency Medicine

## 2020-04-30 DIAGNOSIS — T25122A Burn of first degree of left foot, initial encounter: Secondary | ICD-10-CM

## 2020-04-30 DIAGNOSIS — M79672 Pain in left foot: Secondary | ICD-10-CM

## 2020-04-30 MED ORDER — PREDNISONE 10 MG PO TABS: ORAL_TABLET | ORAL | 0 refills | Status: DC

## 2020-04-30 NOTE — Discharge Instructions (Addendum)
Ice and elevate foot Plan prednisone taper over the next 6 days-begin with 6 tablets, decrease by 1 tablet each day until complete-six, five, four, three, two, one-take with food and earlier in the day if possible Pause diclofenac pills while on prednisone, may restart after last day Follow-up if not improving or worsening

## 2020-04-30 NOTE — ED Notes (Signed)
Ace wrap applied to left foot for mild compression and support

## 2020-04-30 NOTE — ED Provider Notes (Signed)
[facility]    CSN: [id number] Arrival date & time: 04/30/20  1039      History   Chief Complaint Chief Complaint  Patient presents with  . Foot Pain    HPI Jorge Armstrong is a 50 y.o. male  History of hypertension, presenting today for evaluation of left foot pain.  Patient reports that over the past few months he has had pain in his left foot.  Reports that 2 months ago he developed a burn to the inner aspect of his left foot, but reports the pain is more lateral.  Pain worse over the past week.  Reports standing on feet for extended hours at work.  Denies any ankle pain calf or knee pain.  Takes diclofenac daily for knees.  HPI  Past Medical History:  Diagnosis Date  . Arthritis    bilateral knees  . History of chicken pox   . HLD (hyperlipidemia)   . HTN (hypertension)   . Morbid obesity (HCC)   . Seasonal allergies    spring    Patient Active Problem List   Diagnosis Date Noted  . Exertional dyspnea 04/02/2019  . Health maintenance examination 01/11/2019  . Rash 12/19/2016  . Erectile dysfunction 10/26/2015  . Right knee pain 10/10/2014  . HTN (hypertension)   . HLD (hyperlipidemia)   . Seasonal allergies   . Obesity, Class III, BMI 40-49.9 (morbid obesity) (HCC)     Past Surgical History:  Procedure Laterality Date  . SHOULDER SURGERY Right 1990       Home Medications    Prior to Admission medications   Medication Sig Start Date End Date Taking? Authorizing Provider  diclofenac (VOLTAREN) 75 MG EC tablet TAKE 1 TABLET BY MOUTH EVERY DAY 11/17/19  Yes Eustaquio Boyden, MD  lisinopril-hydrochlorothiazide (ZESTORETIC) 20-12.5 MG tablet TAKE 1 TABLET BY MOUTH EVERY DAY 04/19/20  Yes Eustaquio Boyden, MD  metoprolol succinate (TOPROL-XL) 25 MG 24 hr tablet TAKE 1 TABLET BY MOUTH EVERY DAY 03/17/20  Yes Eustaquio Boyden, MD  predniSONE (DELTASONE) 10 MG tablet Begin with 6 tabs on day 1, 5 tab on day 2, 4 tab on day 3, 3 tab on day 4, 2 tab on  day 5, 1 tab on day 6-take with food 04/30/20  Yes Tobyn Osgood C, PA-C  diclofenac Sodium (VOLTAREN) 1 % GEL Apply topically as needed. OTC    [provider]  GLUCOSAMINE HCL PO Take by mouth daily.    [provider]  OVER THE COUNTER MEDICATION Apply topically as needed. CBD topical    [provider]    Family History Family History  Problem Relation Age of Onset  . Hypertension Mother   . Cancer Mother        breast  . CAD Neg Hx   . Stroke Neg Hx   . Diabetes Neg Hx     Social History Social History   Tobacco Use  . Smoking status: Never Smoker  . Smokeless tobacco: Never Used  Substance Use Topics  . Alcohol use: Yes    Comment: Regular (2-3 glasses wine nightly)  . Drug use: No     Allergies   Patient has no known allergies.   Review of Systems Review of Systems  Constitutional: Negative for fatigue and fever.  Eyes: Negative for redness, itching and visual disturbance.  Respiratory: Negative for shortness of breath.   Cardiovascular: Negative for chest pain and leg swelling.  Gastrointestinal: Negative for nausea and vomiting.  Musculoskeletal:  Positive for arthralgias, gait problem and myalgias.  Skin: Positive for color change. Negative for rash and wound.  Neurological: Negative for dizziness, syncope, weakness, light-headedness and headaches.     Physical Exam Triage Vital Signs ED Triage Vitals  Enc Vitals Group     BP 04/30/20 1102 (!) 163/111     Pulse Rate 04/30/20 1102 81     Resp 04/30/20 1102 20     Temp 04/30/20 1102 98.8 F (37.1 C)     Temp Source 04/30/20 1102 Oral     SpO2 04/30/20 1102 99 %     Weight --      Height --      Head Circumference --      Peak Flow --      Pain Score 04/30/20 1100 7     Pain Loc --      Pain Edu? --      Excl. in GC? --    No data found.  Updated Vital Signs BP (!) 163/111 (BP Location: Right Arm)   Pulse 81   Temp 98.8 F (37.1 C) (Oral)   Resp 20   SpO2 99%    Visual Acuity Right Eye Distance:   Left Eye Distance:   Bilateral Distance:    Right Eye Near:   Left Eye Near:    Bilateral Near:     Physical Exam Vitals and nursing note reviewed.  Constitutional:      Appearance: He is well-developed and well-nourished.     Comments: No acute distress  HENT:     Head: Normocephalic and atraumatic.     Nose: Nose normal.  Eyes:     Conjunctiva/sclera: Conjunctivae normal.  Cardiovascular:     Rate and Rhythm: Normal rate.  Pulmonary:     Effort: Pulmonary effort is normal. No respiratory distress.  Abdominal:     General: There is no distension.  Musculoskeletal:        General: Normal range of motion.     Cervical back: Neck supple.     Comments: Left foot: No obvious swelling or deformity or discoloration, mild swelling with tenderness to palpation to proximal lateral aspect of foot along proximal fifth metatarsal, no plantar tenderness, no erythema or warmth, dorsalis pedis 2+  Skin:    General: Skin is warm and dry.  Neurological:     Mental Status: He is alert and oriented to person, place, and time.  Psychiatric:        Mood and Affect: Mood and affect normal.      UC Treatments / Results  Labs (all labs ordered are listed, but only abnormal results are displayed) Labs Reviewed - No data to display  EKG   Radiology DG Foot Complete Left  Result Date: 04/30/2020 CLINICAL DATA:  Burn on dorsum of foot EXAM: LEFT FOOT - COMPLETE 3+ VIEW COMPARISON:  None. FINDINGS: No acute fracture or dislocation. Moderate midfoot degenerative changes with dorsal spurring of the midfoot. Moderate degenerative changes of the first MTP with dorsal spurring. Plantar calcaneal enthesophyte. Achilles tendon enthesophyte. No area of erosion or osseous destruction. No unexpected radiopaque foreign body. Soft tissues are unremarkable. IMPRESSION: 1. No acute osseous abnormality. 2. Moderate degenerative changes of the left foot. Electronically  Signed   By: Meda Klinefelter MD   On: 04/30/2020 11:20    Procedures Procedures (including critical care time)  Medications Ordered in UC Medications - No data to display  Initial Impression / Assessment and Plan / UC Course  I have reviewed the triage vital signs and the nursing notes.  Pertinent labs & imaging results that were available during my care of the patient were reviewed by me and considered in my medical decision making (see chart for details).     X-ray without acute bony abnormality, suspect likely flare of underlying arthritis noted in foot.  Has been using NSAIDs without relief, switching to prednisone, denies history of diabetes.  Ace wrap applied, ice and elevate, rest.  Discussed strict return precautions. Patient verbalized understanding and is agreeable with plan.  Final Clinical Impressions(s) / UC Diagnoses   Final diagnoses:  Left foot pain     Discharge Instructions     Ice and elevate foot Plan prednisone taper over the next 6 days-begin with 6 tablets, decrease by 1 tablet each day until complete-six, five, four, three, two, one-take with food and earlier in the day if possible Pause diclofenac pills while on prednisone, may restart after last day Follow-up if not improving or worsening    ED Prescriptions    Medication Sig Dispense Auth. Provider   predniSONE (DELTASONE) 10 MG tablet Begin with 6 tabs on day 1, 5 tab on day 2, 4 tab on day 3, 3 tab on day 4, 2 tab on day 5, 1 tab on day 6-take with food 21 tablet Jiovany Scheffel C, PA-C     PDMP not reviewed this encounter.   Lew Dawes, [place] 04/30/20 1415
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
## 2020-06-16 ENCOUNTER — Other Ambulatory Visit: Payer: Self-pay | Admitting: Family Medicine

## 2020-06-29 ENCOUNTER — Encounter (HOSPITAL_BASED_OUTPATIENT_CLINIC_OR_DEPARTMENT_OTHER): Payer: Self-pay | Admitting: Family Medicine

## 2020-06-29 ENCOUNTER — Ambulatory Visit (INDEPENDENT_AMBULATORY_CARE_PROVIDER_SITE_OTHER): Payer: Managed Care, Other (non HMO) | Admitting: Family Medicine

## 2020-06-29 ENCOUNTER — Other Ambulatory Visit: Payer: Self-pay

## 2020-06-29 VITALS — BP 162/100 | HR 87 | Ht 71.0 in | Wt 323.4 lb

## 2020-06-29 DIAGNOSIS — I10 Essential (primary) hypertension: Secondary | ICD-10-CM

## 2020-06-29 DIAGNOSIS — Z7689 Persons encountering health services in other specified circumstances: Secondary | ICD-10-CM | POA: Diagnosis not present

## 2020-06-29 DIAGNOSIS — E785 Hyperlipidemia, unspecified: Secondary | ICD-10-CM | POA: Diagnosis not present

## 2020-06-29 MED ORDER — HYDROCHLOROTHIAZIDE 25 MG PO TABS: 25.0000 mg | ORAL_TABLET | Freq: Every day | ORAL | 0 refills | Status: DC

## 2020-06-29 MED ORDER — LISINOPRIL 20 MG PO TABS: 20.0000 mg | ORAL_TABLET | Freq: Every day | ORAL | 0 refills | Status: DC

## 2020-06-29 MED ORDER — METOPROLOL SUCCINATE ER 25 MG PO TB24
1.0000 | ORAL_TABLET | Freq: Every day | ORAL | 0 refills | Status: DC
Start: 2020-06-29 — End: 2020-07-02

## 2020-06-29 NOTE — Assessment & Plan Note (Signed)
Chronic, not at goal today Will make slight changes to blood pressure medications, continue lisinopril 20 mg, increase hydrochlorothiazide to 25 mg, continue metoprolol XL 25 mg Provide BP log for patient to record blood pressures at home, recommend checking throughout the week at various times of the day Discussed DASH diet, working towards healthy weight loss, gradual increase in physical activity Will refer to nutritionist

## 2020-06-29 NOTE — Patient Instructions (Signed)
  Medication Instructions:  Your physician has recommended you make the following change in your medication:  -- STOP Lisinopril - Hydrochlorothizide  -- START Lisinopril 20 mg - Take 1 tablet (20 mg) by mouth daily -- START Hydrochlorothiazide 25 mg - Take 1 tablet (25 mg) by mouth daily --If you need a refill on any your medications before your next appointment, please call your pharmacy first. If no refills are authorized on file call the office.--  Lab Work: Your physician has recommended that you have lab work today: CBC, Comprehensive Metabolic Panel, Lipid Profile, HgB A1C If you have labs (blood work) drawn today and your tests are completely normal, you will receive your results only by: Marland Kitchen MyChart Message (if you have MyChart) OR . A phone call from our staff. Please ensure you check your voicemail in the event that you authorized detailed messages to be left on a delegated number. If you have any lab test that is abnormal or we need to change your treatment, we will call you to review the results.  Referrals/Procedures/Imaging: A referral has been placed for you to [facility]. for evaluation and treatment. Someone from the scheduling department will be in contact with you in regards to coordinating your consultation. If you do not hear from any of the schedulers within 7-10 business days please give our office a call.   Follow-Up: Your next appointment:   Your physician recommends that you schedule a follow-up appointment in: 3 WEEKS with Dr. de Peru  Thanks for letting us be apart of your health journey!!  Primary Care and Sports Medicine   Dr. de Peru and Shawna Clamp, DNP, AGNP  We recommend signing up for the patient portal called "MyChart".  Sign up information is provided on this After Visit Summary.  MyChart is used to connect with patients for Virtual Visits (Telemedicine).  Patients are able to view lab/test results, encounter notes, upcoming  appointments, etc.  Non-urgent messages can be sent to your provider as well.   To learn more about what you can do with MyChart, please visit --  [email or web address].

## 2020-06-29 NOTE — Assessment & Plan Note (Signed)
Check lipid panel Discussed lifestyle modifications as above Likely fall within category that would benefit from statin initiation

## 2020-06-29 NOTE — Assessment & Plan Note (Signed)
Discussed lifestyle modifications as above Referral to nutritionist Monitor weight at follow-up appointments

## 2020-06-29 NOTE — Progress Notes (Signed)
New Patient Office Visit  Subjective:  Patient ID: Jorge Armstrong, male    DOB: [birth year]  Age: 50 y.o. MRN: [id number]  CC:  Chief Complaint  Patient presents with  . Establish Care  . Medication Refill    Patient is no linger under care of previous PCP and needs medication renewals  . Arthritis    Longstanding arthritis in both knees    HPI Jorge Armstrong is a 50 year old male presenting to establish in clinic.  Is needing refills for medications, denies any other specific concerns today.  Reports past medical history of hypertension, hyperlipidemia.  Hypertension: Presently taking lisinopril 20 mg, hydrochlorothiazide 12.5 mg, metoprolol XL 25 mg.  Reports that he has not had any recent changes to these medications.  Review of chart indicates that metoprolol XL was started due to mild tachycardia in setting of hypertension.  Reports that he does check his blood pressure at home, usually in the morning around the same time that he takes his blood pressure medications.  Indicates that he has been high lately, typically about 160/90.  Does try to limit some of his salt intake.  No specific exercise outside of work.  Denies any issues with lightheadedness, dizziness, chest pain, headaches.  Hyperlipidemia: Noted on prior labs, no specific medications currently.  Is due for repeat blood work.  Patient works as a Production designer, theatre/television/film.  His usual shift is 2 PM to 11 PM.  Indicates that due to his work schedule, he finds it difficult to stick with an exercise regimen.  Feels that prior to the pandemic he was exercising regularly but since that time has not returned to a regular regimen.  Does have an exercise bike at home that he wants to resume using.  Past Medical History:  Diagnosis Date  . Arthritis    bilateral knees  . History of chicken pox   . HLD (hyperlipidemia)   . HTN (hypertension)   . Morbid obesity (HCC)   . Seasonal allergies    spring    Past Surgical History:  Procedure  Laterality Date  . SHOULDER SURGERY Right 1990    Family History  Problem Relation Age of Onset  . Hypertension Mother   . Cancer Mother        breast  . CAD Neg Hx   . Stroke Neg Hx   . Diabetes Neg Hx     Social History   Socioeconomic History  . Marital status: Single    Spouse name: Not on file  . Number of children: Not on file  . Years of education: Not on file  . Highest education level: Not on file  Occupational History  . Not on file  Tobacco Use  . Smoking status: Never Smoker  . Smokeless tobacco: Never Used  Substance and Sexual Activity  . Alcohol use: Yes    Comment: Regular (2-3 glasses wine nightly)  . Drug use: No  . Sexual activity: Yes  Other Topics Concern  . Not on file  Social History Narrative   Lives with fiance and her son, 1 dog    Occ: Oxygen tank filler, part time job on weekends    Edu: bachelor's    Activity: no reg exercise    Diet: good water, fruits/vegetables some   Social Determinants of Health   Financial Resource Strain: Not on file  Food Insecurity: Not on file  Transportation Needs: Not on file  Physical Activity: Not on file  Stress: Not on file  Social Connections: Not on file  Intimate Partner Violence: Not on file    Objective:   Today's Vitals: BP (!) 162/100   Pulse 87   Ht 5\' 11"  (1.803 m)   Wt (!) 323 lb 6.4 oz (146.7 kg)   SpO2 97%   BMI 45.11 kg/m   Physical Exam  Pleasant 50 year old male in no acute distress Cardiovascular exam with regular rate and rhythm, no murmurs appreciated Lungs clear to auscultation bilaterally  Assessment & Plan:   Problem List Items Addressed This Visit      Cardiovascular and Mediastinum   HTN (hypertension)   Relevant Medications   lisinopril (ZESTRIL) 20 MG tablet   hydrochlorothiazide (HYDRODIURIL) 25 MG tablet   metoprolol succinate (TOPROL-XL) 25 MG 24 hr tablet     Other   HLD (hyperlipidemia)   Relevant Medications   lisinopril (ZESTRIL) 20 MG tablet    hydrochlorothiazide (HYDRODIURIL) 25 MG tablet   metoprolol succinate (TOPROL-XL) 25 MG 24 hr tablet    Other Visit Diagnoses    Essential hypertension    -  Primary   Relevant Medications   lisinopril (ZESTRIL) 20 MG tablet   hydrochlorothiazide (HYDRODIURIL) 25 MG tablet   metoprolol succinate (TOPROL-XL) 25 MG 24 hr tablet   Other Relevant Orders   Comprehensive metabolic panel   CBC with Differential/Platelet   Hemoglobin A1c   Lipid panel   Amb ref to Medical Nutrition Therapy-MNT   Establishing care with new doctor, encounter for       Relevant Orders   Comprehensive metabolic panel   CBC with Differential/Platelet   Hemoglobin A1c   Lipid panel      Outpatient Encounter Medications as of 06/29/2020  Medication Sig  . diclofenac (VOLTAREN) 75 MG EC tablet TAKE 1 TABLET BY MOUTH EVERY DAY  . diclofenac Sodium (VOLTAREN) 1 % GEL Apply topically as needed. OTC  . GLUCOSAMINE HCL PO Take by mouth daily.  . hydrochlorothiazide (HYDRODIURIL) 25 MG tablet Take 1 tablet (25 mg total) by mouth daily.  08/29/2020 lisinopril (ZESTRIL) 20 MG tablet Take 1 tablet (20 mg total) by mouth daily.  Marland Kitchen OVER THE COUNTER MEDICATION Apply topically as needed. CBD topical  . [DISCONTINUED] lisinopril-hydrochlorothiazide (ZESTORETIC) 20-12.5 MG tablet TAKE 1 TABLET BY MOUTH EVERY DAY  . [DISCONTINUED] metoprolol succinate (TOPROL-XL) 25 MG 24 hr tablet TAKE 1 TABLET BY MOUTH EVERY DAY  . [DISCONTINUED] predniSONE (DELTASONE) 10 MG tablet Begin with 6 tabs on day 1, 5 tab on day 2, 4 tab on day 3, 3 tab on day 4, 2 tab on day 5, 1 tab on day 6-take with food  . metoprolol succinate (TOPROL-XL) 25 MG 24 hr tablet Take 1 tablet (25 mg total) by mouth daily.   No facility-administered encounter medications on file as of 06/29/2020.   Spent 45 minutes on this patient encounter, including preparation, chart review, face-to-face counseling with patient and coordination of care, and documentation of  encounter  Follow-up: Return in about 3 weeks (around 07/20/2020).  Recheck blood pressure at next visit, review blood pressure log, review labs, consider statin initiation if indicated  Yuriana Gaal J De 07/22/2020, MD

## 2020-07-02 ENCOUNTER — Other Ambulatory Visit (HOSPITAL_BASED_OUTPATIENT_CLINIC_OR_DEPARTMENT_OTHER): Payer: Self-pay | Admitting: Family Medicine

## 2020-07-03 MED ORDER — METOPROLOL SUCCINATE ER 25 MG PO TB24
1.0000 | ORAL_TABLET | Freq: Every day | ORAL | 1 refills | Status: DC
Start: 2020-07-03 — End: 2020-07-31

## 2020-07-04 ENCOUNTER — Encounter: Payer: Self-pay | Admitting: Registered"

## 2020-07-04 ENCOUNTER — Other Ambulatory Visit: Payer: Self-pay

## 2020-07-04 ENCOUNTER — Encounter: Payer: Managed Care, Other (non HMO) | Attending: Family Medicine | Admitting: Registered"

## 2020-07-04 DIAGNOSIS — I10 Essential (primary) hypertension: Secondary | ICD-10-CM | POA: Insufficient documentation

## 2020-07-04 NOTE — Progress Notes (Signed)
Medical Nutrition Therapy  Appointment Start time:  10:35  Appointment End time:  11:26  Primary concerns today: to lower blood pressure and heart rate; wants a good diet to help with this Referral diagnosis: hypertension Preferred learning style: no preference indicated Learning readiness: ready, change in progress   NUTRITION ASSESSMENT   States he has been gaining weight since he got married in 2000. States he checks BP and heart rate daily; today was 153/91 and 85 respectively. States he has had some recent medication changes which is lowering BP but still has high heartrate. States his BP is starting to decrease but his HR is still high. States he will see PCP at the end of May, saw for the first time last week. States he enjoys eating the Impossible Frankey Poot once a week; sometimes from [employer] with onion rings and other times will make at home with salad. States he stopped eating beef and pork in Jan 2022. Pt states he and wife are interested in following a Mediterranean way of eating.   States he has stationary bike at home and plans to start using it this week. States he [place] the [place] park with a walking trail. Reports he likes to walk but his knees don't like it due to arthritis.   Stats he currently works 2nd shift 2-10:30 pm, 6 days/week. Works at [employer].   Pt expectations: good diet    Clinical Medical Hx: HTN Medications: See list Labs: recent BP 153/91 Notable Signs/Symptoms: sometimes shortness of breath  Lifestyle & Dietary Hx  Estimated daily fluid intake: 57 oz Supplements: See list Sleep: 8 hrs Stress / self-care: prayer Current average weekly physical activity: none reported  24-Hr Dietary Recall First Meal (9 am): coffee + cinnamon bun Snack:  Second Meal (1 pm): Dione Plover - veggie burrito Snack:  Third Meal (7 pm): salad with grilled chicken + lite ranch dressing Snack: peanuts + 2 glasses of wine Beverages: coffee (8 oz), water  (2-3*16 oz; 32-48 oz), soda (1*12 oz; 12 oz), wine (sometimes 1-2 glasses, daily); ~57 oz   NUTRITION DIAGNOSIS  NB-1.1 Food and nutrition-related knowledge deficit As related to hypertension.  As evidenced by lack of prior nutrition-related education.   NUTRITION INTERVENTION  Nutrition education (E-1) on the following topics: Nutrition education and counseling. Pt was educated and counseled on hypertension, nutritional ways to lower blood pressure, ways to increase fiber, vitamin, and mineral intake, and ways to increase physical activity. Shared upcoming resource about Mediterranean eating plan virtual series. Pt agreed with goals listed.  Handouts Provided Include   Hypertension Nutrition Therapy  Learning Style & Readiness for Change Teaching method utilized: Visual & Auditory  Demonstrated degree of understanding via: Teach Back  Barriers to learning/adherence to lifestyle change: work-life balance  Goals Established by Pt  Check out Mediterranean eating pattern virtual series at [email or web address]  Starts Tues, 5/17.   Aim to increase fruit, vegetable intake with each meal.   Increase physical activity 20 min, 2 days/week.   Increase water intake to at least 4 bottles of water a day in addition to other fluids.    MONITORING & EVALUATION Dietary intake and weekly physical activity.  Next Steps  Patient is to follow-up prn.

## 2020-07-04 NOTE — Patient Instructions (Addendum)
-   Check out Mediterranean eating pattern virtual series at [email or web address]  Starts Tues, 5/17.   - Aim to increase fruit, vegetable intake with each meal.   - Increase physical activity 20 min, 2 days/week.   - Increase water intake to at least 4 bottles of water a day in addition to other fluids.

## 2020-07-05 ENCOUNTER — Encounter (HOSPITAL_BASED_OUTPATIENT_CLINIC_OR_DEPARTMENT_OTHER): Payer: Self-pay | Admitting: Family Medicine

## 2020-07-13 ENCOUNTER — Other Ambulatory Visit: Payer: Self-pay

## 2020-07-13 ENCOUNTER — Other Ambulatory Visit (HOSPITAL_BASED_OUTPATIENT_CLINIC_OR_DEPARTMENT_OTHER)
Admission: RE | Admit: 2020-07-13 | Discharge: 2020-07-13 | Disposition: A | Discharge status: Home / Self Care | Payer: Managed Care, Other (non HMO) | Source: Ambulatory Visit | Attending: Family Medicine | Admitting: Family Medicine

## 2020-07-13 DIAGNOSIS — I10 Essential (primary) hypertension: Secondary | ICD-10-CM | POA: Diagnosis present

## 2020-07-13 DIAGNOSIS — Z7689 Persons encountering health services in other specified circumstances: Secondary | ICD-10-CM

## 2020-07-13 LAB — CBC WITH DIFFERENTIAL/PLATELET
Abs Immature Granulocytes: 0.02 10*3/uL (ref 0.00–0.07)
Basophils Absolute: 0.1 10*3/uL (ref 0.0–0.1)
Basophils Relative: 1 %
Eosinophils Absolute: 0.2 10*3/uL (ref 0.0–0.5)
Eosinophils Relative: 3 %
HCT: 43.7 % (ref 39.0–52.0)
Hemoglobin: 14.4 g/dL (ref 13.0–17.0)
Immature Granulocytes: 0 %
Lymphocytes Relative: 25 %
Lymphs Abs: 1.9 10*3/uL (ref 0.7–4.0)
MCH: 27.6 pg (ref 26.0–34.0)
MCHC: 33 g/dL (ref 30.0–36.0)
MCV: 83.7 fL (ref 80.0–100.0)
Monocytes Absolute: 0.8 10*3/uL (ref 0.1–1.0)
Monocytes Relative: 11 %
Neutro Abs: 4.7 10*3/uL (ref 1.7–7.7)
Neutrophils Relative %: 60 %
Platelets: 268 10*3/uL (ref 150–400)
RBC: 5.22 MIL/uL (ref 4.22–5.81)
RDW: 13.4 % (ref 11.5–15.5)
WBC: 7.7 10*3/uL (ref 4.0–10.5)
nRBC: 0 % (ref 0.0–0.2)

## 2020-07-13 LAB — COMPREHENSIVE METABOLIC PANEL
ALT: 18 U/L (ref 0–44)
AST: 17 U/L (ref 15–41)
Albumin: 4.2 g/dL (ref 3.5–5.0)
Alkaline Phosphatase: 49 U/L (ref 38–126)
Anion gap: 8 (ref 5–15)
BUN: 23 mg/dL — ABNORMAL HIGH (ref 6–20)
CO2: 28 mmol/L (ref 22–32)
Calcium: 9.9 mg/dL (ref 8.9–10.3)
Chloride: 96 mmol/L — ABNORMAL LOW (ref 98–111)
Creatinine, Ser: 1.4 mg/dL — ABNORMAL HIGH (ref 0.61–1.24)
GFR, Estimated: >60 mL/min (ref >60)
Glucose, Bld: 102 mg/dL — ABNORMAL HIGH (ref 70–99)
Potassium: 3.9 mmol/L (ref 3.5–5.1)
Sodium: 132 mmol/L — ABNORMAL LOW (ref 135–145)
Total Bilirubin: 1 mg/dL (ref 0.3–1.2)
Total Protein: 7.6 g/dL (ref 6.5–8.1)

## 2020-07-13 LAB — LIPID PANEL
Cholesterol: 223 mg/dL — ABNORMAL HIGH (ref 0–200)
HDL: 43 mg/dL (ref >40)
LDL Cholesterol: 155 mg/dL — ABNORMAL HIGH (ref 0–99)
Total CHOL/HDL Ratio: 5.2 RATIO
Triglycerides: 125 mg/dL (ref <150)
VLDL: 25 mg/dL (ref 0–40)

## 2020-07-13 LAB — HEMOGLOBIN A1C
Hgb A1c MFr Bld: 6.3 % — ABNORMAL HIGH (ref 4.8–5.6)
Mean Plasma Glucose: 134.11 mg/dL

## 2020-07-20 ENCOUNTER — Ambulatory Visit (HOSPITAL_BASED_OUTPATIENT_CLINIC_OR_DEPARTMENT_OTHER): Payer: Managed Care, Other (non HMO) | Admitting: Family Medicine

## 2020-07-22 ENCOUNTER — Other Ambulatory Visit (HOSPITAL_BASED_OUTPATIENT_CLINIC_OR_DEPARTMENT_OTHER): Payer: Self-pay | Admitting: Family Medicine

## 2020-07-22 NOTE — Telephone Encounter (Signed)
Medications prescribed at office visit on 05/05 per Dr. de Peru Patient scheduled to follow up on 06/06 Will authorize 1 refill and can have further refills authorized after appointment completion

## 2020-07-25 ENCOUNTER — Other Ambulatory Visit: Payer: Self-pay | Admitting: Family Medicine

## 2020-07-26 ENCOUNTER — Encounter (HOSPITAL_BASED_OUTPATIENT_CLINIC_OR_DEPARTMENT_OTHER): Payer: Self-pay | Admitting: Family Medicine

## 2020-07-31 ENCOUNTER — Encounter (HOSPITAL_BASED_OUTPATIENT_CLINIC_OR_DEPARTMENT_OTHER): Payer: Self-pay | Admitting: Family Medicine

## 2020-07-31 ENCOUNTER — Ambulatory Visit (INDEPENDENT_AMBULATORY_CARE_PROVIDER_SITE_OTHER): Payer: Managed Care, Other (non HMO) | Admitting: Family Medicine

## 2020-07-31 ENCOUNTER — Other Ambulatory Visit: Payer: Self-pay

## 2020-07-31 VITALS — BP 132/76 | HR 84 | Ht 71.0 in | Wt 311.6 lb

## 2020-07-31 DIAGNOSIS — I10 Essential (primary) hypertension: Secondary | ICD-10-CM | POA: Diagnosis not present

## 2020-07-31 DIAGNOSIS — E785 Hyperlipidemia, unspecified: Secondary | ICD-10-CM

## 2020-07-31 DIAGNOSIS — E871 Hypo-osmolality and hyponatremia: Secondary | ICD-10-CM | POA: Diagnosis not present

## 2020-07-31 MED ORDER — HYDROCHLOROTHIAZIDE 25 MG PO TABS: 25.0000 mg | ORAL_TABLET | Freq: Every day | ORAL | 0 refills | Status: DC

## 2020-07-31 MED ORDER — METOPROLOL SUCCINATE ER 25 MG PO TB24: 1.0000 | ORAL_TABLET | Freq: Every day | ORAL | 0 refills | Status: DC

## 2020-07-31 MED ORDER — LISINOPRIL 20 MG PO TABS: 1.0000 | ORAL_TABLET | Freq: Every day | ORAL | 0 refills | Status: DC

## 2020-07-31 NOTE — Assessment & Plan Note (Signed)
ASCVD risk with recent lipid panel is 11% Discussed the significance of ASCVD risk calculation and recommendation for statin therapy Patient wishes to proceed with lifestyle modifications with repeat lipid panel in 6 to 12 months Discussed lifestyle modification measures including incorporating fresh fruits and vegetables, lean protein in the diet and reducing consumption of red meats, saturated fats, processed foods.  As well as gradually increasing level of activity.  Eventual goal should be started about 150 minutes/week of moderate intensity aerobic exercise.

## 2020-07-31 NOTE — Assessment & Plan Note (Signed)
Chronic, at goal in office today, home readings with some slightly elevated systolic readings Continue with current medications including lisinopril 20 mg, hydrochlorothiazide 25 mg, metoprolol XL 25 mg Repeat BMP due to slight electrolyte abnormalities in setting of increased HCTZ dose Continue with lifestyle modifications including dietary salt restriction, gradual increase in physical activity, working towards healthy weight loss

## 2020-07-31 NOTE — Patient Instructions (Signed)
  Medication Instructions:  Your physician recommends that you continue on your current medications as directed. Please refer to the Current Medication list given to you today. --If you need a refill on any your medications before your next appointment, please call your pharmacy first. If no refills are authorized on file call the office.--  Lab Work: Your physician has recommended that you have lab work today: BMET If you have labs (blood work) drawn today and your tests are completely normal, you will receive your results only by: Marland Kitchen MyChart Message (if you have MyChart) OR . A phone call from our staff. Please ensure you check your voicemail in the event that you authorized detailed messages to be left on a delegated number. If you have any lab test that is abnormal or we need to change your treatment, we will call you to review the results.  Follow-Up: Your next appointment:   Your physician recommends that you schedule a follow-up appointment in: 3-4 MONTHS with Dr. de Peru  Thanks for letting us be apart of your health journey!!  Primary Care and Sports Medicine   Dr. de Peru and Shawna Clamp, DNP, AGNP  We recommend signing up for the patient portal called "MyChart".  Sign up information is provided on this After Visit Summary.  MyChart is used to connect with patients for Virtual Visits (Telemedicine).  Patients are able to view lab/test results, encounter notes, upcoming appointments, etc.  Non-urgent messages can be sent to your provider as well.   To learn more about what you can do with MyChart, please visit --  [email or web address].

## 2020-07-31 NOTE — Assessment & Plan Note (Addendum)
Noted on recent labs, suspect related to recent HCTZ dose change, will repeat BMP to assess status

## 2020-07-31 NOTE — Progress Notes (Signed)
    Procedures performed today:    None.  Independent interpretation of notes and tests performed by another provider:   None.  Brief History, Exam, Impression, and Recommendations:    Jorge Armstrong is a 50 year old male presenting for follow-up.  Hypertension: Has been checking blood pressure at home, reports that systolic has been about 130-140s range and diastolic has been 70-80 range.  Denies any issue with chest pain, shortness of breath, lightheadedness or dizziness.  No persistent headaches.  Hyperlipidemia: Recent labs revealed elevated LDL and low HDL.  Has had issues with this in the past.  Has primarily been managing with lifestyle modifications.  Hyponatremia: Noted on most recent metabolic profile.  Was instructed on repeating labs, however has not completed this yet.  BP 132/76   Pulse 84   Ht 5\' 11"  (1.803 m)   Wt (!) 311 lb 9.6 oz (141.3 kg)   SpO2 98%   BMI 43.46 kg/m   HTN (hypertension) Chronic, at goal in office today, home readings with some slightly elevated systolic readings Continue with current medications including lisinopril 20 mg, hydrochlorothiazide 25 mg, metoprolol XL 25 mg Repeat BMP due to slight electrolyte abnormalities in setting of increased HCTZ dose Continue with lifestyle modifications including dietary salt restriction, gradual increase in physical activity, working towards healthy weight loss  HLD (hyperlipidemia) ASCVD risk with recent lipid panel is 11% Discussed the significance of ASCVD risk calculation and recommendation for statin therapy Patient wishes to proceed with lifestyle modifications with repeat lipid panel in 6 to 12 months Discussed lifestyle modification measures including incorporating fresh fruits and vegetables, lean protein in the diet and reducing consumption of red meats, saturated fats, processed foods.  As well as gradually increasing level of activity.  Eventual goal should be started about 150 minutes/week of  moderate intensity aerobic exercise.  Obesity, Class III, BMI 40-49.9 (morbid obesity) Lifestyle modifications discussed as above Continue with measures discussed with dietitian May consider pharmacotherapy in the future given BMI and associated comorbidities  Hyponatremia Noted on recent labs, suspect related to recent HCTZ dose change, will repeat BMP to assess status  Plan for follow-up in about 3 to 4 months or sooner as needed.  Follow-up on blood pressure, BMI   ___________________________________________ Tenesia Escudero de June, MD, ABFM, [facility] Primary Care and Sports Medicine [facility]

## 2020-07-31 NOTE — Assessment & Plan Note (Addendum)
Lifestyle modifications discussed as above Continue with measures discussed with dietitian May consider pharmacotherapy in the future given BMI and associated comorbidities

## 2020-08-01 LAB — BASIC METABOLIC PANEL
BUN/Creatinine Ratio: 13 (ref 9–20)
BUN: 17 mg/dL (ref 6–24)
CO2: 25 mmol/L (ref 20–29)
Calcium: 10.2 mg/dL (ref 8.7–10.2)
Chloride: 102 mmol/L (ref 96–106)
Creatinine, Ser: 1.32 mg/dL — ABNORMAL HIGH (ref 0.76–1.27)
Glucose: 94 mg/dL (ref 65–99)
Potassium: 5.1 mmol/L (ref 3.5–5.2)
Sodium: 139 mmol/L (ref 134–144)
eGFR: 65 mL/min/{1.73_m2} (ref >59)

## 2020-08-07 ENCOUNTER — Other Ambulatory Visit: Payer: Self-pay | Admitting: Family Medicine

## 2020-08-09 ENCOUNTER — Encounter (HOSPITAL_BASED_OUTPATIENT_CLINIC_OR_DEPARTMENT_OTHER): Payer: Self-pay | Admitting: Family Medicine

## 2020-08-09 NOTE — Telephone Encounter (Signed)
Voltaren tab Last filled:  07/11/20, #30 Last OV:  04/02/19, SOB Next OV:  none;  pt has failed to respond to attempts to schedule CPE

## 2020-08-22 NOTE — Telephone Encounter (Signed)
Called patient to discuss diclofenac.  No answer.

## 2020-08-23 ENCOUNTER — Other Ambulatory Visit (HOSPITAL_BASED_OUTPATIENT_CLINIC_OR_DEPARTMENT_OTHER): Payer: Self-pay | Admitting: Family Medicine

## 2020-08-23 DIAGNOSIS — I10 Essential (primary) hypertension: Secondary | ICD-10-CM

## 2020-08-23 NOTE — Telephone Encounter (Signed)
Patient scheduled for follow up in October Will authorize until next office visit to pharmacy on file

## 2020-11-12 ENCOUNTER — Other Ambulatory Visit (HOSPITAL_BASED_OUTPATIENT_CLINIC_OR_DEPARTMENT_OTHER): Payer: Self-pay | Admitting: Family Medicine

## 2020-11-12 DIAGNOSIS — I10 Essential (primary) hypertension: Secondary | ICD-10-CM

## 2020-11-30 ENCOUNTER — Other Ambulatory Visit: Payer: Self-pay

## 2020-11-30 ENCOUNTER — Ambulatory Visit (INDEPENDENT_AMBULATORY_CARE_PROVIDER_SITE_OTHER): Payer: Managed Care, Other (non HMO) | Admitting: Family Medicine

## 2020-11-30 VITALS — BP 132/68 | HR 78 | Ht 72.0 in | Wt 301.4 lb

## 2020-11-30 DIAGNOSIS — R7303 Prediabetes: Secondary | ICD-10-CM | POA: Diagnosis not present

## 2020-11-30 DIAGNOSIS — M25562 Pain in left knee: Secondary | ICD-10-CM

## 2020-11-30 DIAGNOSIS — M170 Bilateral primary osteoarthritis of knee: Secondary | ICD-10-CM

## 2020-11-30 DIAGNOSIS — I10 Essential (primary) hypertension: Secondary | ICD-10-CM

## 2020-11-30 DIAGNOSIS — G8929 Other chronic pain: Secondary | ICD-10-CM

## 2020-11-30 NOTE — Assessment & Plan Note (Signed)
Reports chronic bilateral knee pain Reports receiving a steroid injection in the past, review of chart indicates that in April 2021 he did receive intra-articular steroid for right knee, in November 2021 it appears he received IM steroid for knee pain No specific injuries in the past, however patient reports that he played a lot of football and other sports when he was younger Has had right knee x-ray completed November 2021 with evidence of osteoarthritis No prior left knee imaging We will proceed with left knee imaging Can continue with conservative measures as far as pain control Suspect that imaging will reveal evidence of arthritis Discussed management recommendations including conservative measures, physical therapy, home exercise program as per PT, intra-articular steroid injection Patient likely would be interested in an intra-articular steroid injection, can consider after left knee imaging completed at patient's preference regarding timing He will contact the office for appointment as needed for any worsening knee pain, steroid injection

## 2020-11-30 NOTE — Progress Notes (Signed)
    Procedures performed today:    None.  Independent interpretation of notes and tests performed by another provider:   None.  Brief History, Exam, Impression, and Recommendations:    BP 132/68   Pulse 78   Ht 6' (1.829 m)   Wt (!) 301 lb 6.4 oz (136.7 kg)   SpO2 99%   BMI 40.88 kg/m   Left knee pain Reports chronic bilateral knee pain Reports receiving a steroid injection in the past, review of chart indicates that in April 2021 he did receive intra-articular steroid for right knee, in November 2021 it appears he received IM steroid for knee pain No specific injuries in the past, however patient reports that he played a lot of football and other sports when he was younger Has had right knee x-ray completed November 2021 with evidence of osteoarthritis No prior left knee imaging We will proceed with left knee imaging Can continue with conservative measures as far as pain control Suspect that imaging will reveal evidence of arthritis Discussed management recommendations including conservative measures, physical therapy, home exercise program as per PT, intra-articular steroid injection Patient likely would be interested in an intra-articular steroid injection, can consider after left knee imaging completed at patient's preference regarding timing He will contact the office for appointment as needed for any worsening knee pain, steroid injection  HTN (hypertension) Blood pressure at goal in office today.  Reports that he is checking it at home, indicates similar readings as what we got in office today Continues with lisinopril, metoprolol, hydrochlorothiazide Denies any issues with chest pain, headaches, lightheadedness or dizziness Recommend continuing with current medications, recommend DASH diet, recommend working towards healthy, gradual weight loss  Obesity, Class III, BMI 40-49.9 (morbid obesity) Discussed lifestyle modifications, working towards healthy, gradual weight  loss Discussed importance in decreasing BMI in relation to managing chronic medical conditions, reducing risk of development of chronic medical conditions, improved control of symptoms related to arthritis of the lower extremity joints  Prediabetes Reviewed most recent labs with patient, hemoglobin A1c found to be 6.3% which is within prediabetes range Discussed appropriate lifestyle modifications, working towards healthy, gradual weight loss Discussed option of initiating metformin, patient wishes to hold off on this for now Plan repeat hemoglobin A1c in about 2 to 3 months  Plan for follow-up in about 2 to 3 months for follow-up of hypertension and prediabetes Patient to call and schedule appointment as desired for management of bilateral knee pain, consideration of intra-articular steroid injection   ___________________________________________ Jorge Brotherton de Peru, MD, ABFM, CAQSM Primary Care and Sports Medicine [facility]

## 2020-11-30 NOTE — Assessment & Plan Note (Signed)
Blood pressure at goal in office today.  Reports that he is checking it at home, indicates similar readings as what we got in office today Continues with lisinopril, metoprolol, hydrochlorothiazide Denies any issues with chest pain, headaches, lightheadedness or dizziness Recommend continuing with current medications, recommend DASH diet, recommend working towards healthy, gradual weight loss

## 2020-11-30 NOTE — Assessment & Plan Note (Signed)
Discussed lifestyle modifications, working towards healthy, gradual weight loss Discussed importance in decreasing BMI in relation to managing chronic medical conditions, reducing risk of development of chronic medical conditions, improved control of symptoms related to arthritis of the lower extremity joints

## 2020-11-30 NOTE — Assessment & Plan Note (Signed)
Reviewed most recent labs with patient, hemoglobin A1c found to be 6.3% which is within prediabetes range Discussed appropriate lifestyle modifications, working towards healthy, gradual weight loss Discussed option of initiating metformin, patient wishes to hold off on this for now Plan repeat hemoglobin A1c in about 2 to 3 months

## 2020-12-01 ENCOUNTER — Ambulatory Visit
Admission: RE | Admit: 2020-12-01 | Discharge: 2020-12-01 | Disposition: A | Discharge status: Home / Self Care | Payer: Managed Care, Other (non HMO) | Source: Ambulatory Visit | Attending: Family Medicine | Admitting: Family Medicine

## 2020-12-01 DIAGNOSIS — G8929 Other chronic pain: Secondary | ICD-10-CM

## 2020-12-01 DIAGNOSIS — M25562 Pain in left knee: Secondary | ICD-10-CM

## 2020-12-04 ENCOUNTER — Encounter (HOSPITAL_BASED_OUTPATIENT_CLINIC_OR_DEPARTMENT_OTHER): Payer: Self-pay | Admitting: Family Medicine

## 2020-12-19 ENCOUNTER — Ambulatory Visit (HOSPITAL_BASED_OUTPATIENT_CLINIC_OR_DEPARTMENT_OTHER): Payer: Managed Care, Other (non HMO) | Admitting: Physical Therapy

## 2021-01-08 ENCOUNTER — Other Ambulatory Visit (HOSPITAL_BASED_OUTPATIENT_CLINIC_OR_DEPARTMENT_OTHER): Payer: Self-pay | Admitting: Family Medicine

## 2021-01-08 DIAGNOSIS — I10 Essential (primary) hypertension: Secondary | ICD-10-CM

## 2021-02-10 ENCOUNTER — Other Ambulatory Visit (HOSPITAL_BASED_OUTPATIENT_CLINIC_OR_DEPARTMENT_OTHER): Payer: Self-pay | Admitting: Family Medicine

## 2021-02-10 DIAGNOSIS — I10 Essential (primary) hypertension: Secondary | ICD-10-CM

## 2021-02-22 ENCOUNTER — Encounter (HOSPITAL_BASED_OUTPATIENT_CLINIC_OR_DEPARTMENT_OTHER): Payer: Self-pay | Admitting: Family Medicine

## 2021-03-02 ENCOUNTER — Other Ambulatory Visit: Payer: Self-pay

## 2021-03-02 ENCOUNTER — Encounter (HOSPITAL_BASED_OUTPATIENT_CLINIC_OR_DEPARTMENT_OTHER): Payer: Self-pay | Admitting: Family Medicine

## 2021-03-02 ENCOUNTER — Ambulatory Visit (INDEPENDENT_AMBULATORY_CARE_PROVIDER_SITE_OTHER): Payer: Managed Care, Other (non HMO) | Admitting: Family Medicine

## 2021-03-02 VITALS — BP 140/82 | HR 86 | Ht 72.0 in | Wt 308.0 lb

## 2021-03-02 DIAGNOSIS — I10 Essential (primary) hypertension: Secondary | ICD-10-CM

## 2021-03-02 DIAGNOSIS — R7303 Prediabetes: Secondary | ICD-10-CM

## 2021-03-02 DIAGNOSIS — M170 Bilateral primary osteoarthritis of knee: Secondary | ICD-10-CM

## 2021-03-02 LAB — HEMOGLOBIN A1C
Est. average glucose Bld gHb Est-mCnc: 117 mg/dL
Hgb A1c MFr Bld: 5.7 % — ABNORMAL HIGH (ref 4.8–5.6)

## 2021-03-02 NOTE — Assessment & Plan Note (Signed)
Denies any issues with chest pain, headaches, lightheadedness or dizziness Blood pressure today is borderline, was at goal at last office visit Continues with lisinopril and hydrochlorothiazide Continue current medications, recommend DASH diet, recommend gradual increase in weekly activity, working towards healthy, gradual weight loss

## 2021-03-02 NOTE — Assessment & Plan Note (Signed)
Most recent hemoglobin A1c found to be 6.3% in May 2022 We will recheck hemoglobin A1c today to monitor As discussed above, recommend lifestyle modifications in order to help address this issue as well as working towards healthy weight loss for better control of other chronic medical conditions

## 2021-03-02 NOTE — Assessment & Plan Note (Signed)
Has received prior steroid injection to right knee in the past, most recently April 2021.  Indicates relief of symptoms at that time, but they have gradually returned Most recently he received IM steroid which was not as beneficial in regards to pain control Reviewed recent imaging with patient which does show evidence of left knee osteoarthritis as well as evidence of chondrocalcinosis, suggestive of CPDD Discussed management options, patient is interested in intra-articular injection today for bilateral knees.  Performed today with procedure note above Can continue conservative measures Recommend gradual weight loss as part of management as well as given possibility of future surgical requirements

## 2021-03-02 NOTE — Patient Instructions (Signed)
°  Medication Instructions:  Your physician recommends that you continue on your current medications as directed. Please refer to the Current Medication list given to you today. --If you need a refill on any your medications before your next appointment, please call your pharmacy first. If no refills are authorized on file call the office.-- Lab Work: Your physician has recommended that you have lab work today: A1C If you have labs (blood work) drawn today and your tests are completely normal, you will receive your results via MyChart message OR a phone call from our staff.  Please ensure you check your voicemail in the event that you authorized detailed messages to be left on a delegated number. If you have any lab test that is abnormal or we need to change your treatment, we will call you to review the results.  Follow-Up: Your next appointment:   Your physician recommends that you schedule a follow-up appointment in: 3-4 MONTHS for CPE with Dr. de Peru  You will receive a text message or e-mail with a link to a survey about your care and experience with [place] today! We would greatly appreciate your feedback!   Thanks for letting us be apart of your health journey!!  Primary Care and Sports Medicine   Dr. Ceasar Mons Peru   We encourage you to activate your patient portal called "MyChart".  Sign up information is provided on this After Visit Summary.  MyChart is used to connect with patients for Virtual Visits (Telemedicine).  Patients are able to view lab/test results, encounter notes, upcoming appointments, etc.  Non-urgent messages can be sent to your provider as well. To learn more about what you can do with MyChart, please visit --  [email or web address].

## 2021-03-02 NOTE — Progress Notes (Signed)
° ° °  Procedures performed today:    Procedure: Injection of bilateral knee joints Verbal informed consent obtained.  Time-out conducted.  Noted no overlying erythema, induration, or other signs of local infection.  Skin prepped in a sterile fashion.  Local anesthesia: Topical Ethyl chloride.  With sterile technique: 2 cc Kenalog 40, 3 cc lidocaine injected easily Completed without difficulty  Procedure was completed initially for left knee and same process was completed on right knee with same medications used Advised to call if fevers/chills, erythema, induration, drainage, or persistent bleeding.  Impression: Technically successful bilateral injections of knee joints.  Independent interpretation of notes and tests performed by another provider:   None.  Brief History, Exam, Impression, and Recommendations:    BP 140/82    Pulse 86    Ht 6' (1.829 m)    Wt (!) 308 lb (139.7 kg)    SpO2 98%    BMI 41.77 kg/m   HTN (hypertension) Denies any issues with chest pain, headaches, lightheadedness or dizziness Blood pressure today is borderline, was at goal at last office visit Continues with lisinopril and hydrochlorothiazide Continue current medications, recommend DASH diet, recommend gradual increase in weekly activity, working towards healthy, gradual weight loss  Bilateral primary osteoarthritis of knee Has received prior steroid injection to right knee in the past, most recently April 2021.  Indicates relief of symptoms at that time, but they have gradually returned Most recently he received IM steroid which was not as beneficial in regards to pain control Reviewed recent imaging with patient which does show evidence of left knee osteoarthritis as well as evidence of chondrocalcinosis, suggestive of CPDD Discussed management options, patient is interested in intra-articular injection today for bilateral knees.  Performed today with procedure note above Can continue conservative  measures Recommend gradual weight loss as part of management as well as given possibility of future surgical requirements  Prediabetes Most recent hemoglobin A1c found to be 6.3% in May 2022 We will recheck hemoglobin A1c today to monitor As discussed above, recommend lifestyle modifications in order to help address this issue as well as working towards healthy weight loss for better control of other chronic medical conditions  Feel that working towards gradual weight loss is likely to be beneficial regarding his chronic medical issues as well as chronic bilateral knee pain.  Patient aware and will be working towards this goal Plan for follow-up in about 4 months for CPE.  Nurse visit 1 week prior for labs including CBC, CMP, lipid panel, A1c, TSH w/ reflex.   ___________________________________________ Alyrica Thurow de [place], MD, ABFM, CAQSM Primary Care and Sports Medicine [facility]

## 2021-03-11 ENCOUNTER — Other Ambulatory Visit (HOSPITAL_BASED_OUTPATIENT_CLINIC_OR_DEPARTMENT_OTHER): Payer: Self-pay | Admitting: Family Medicine

## 2021-03-11 DIAGNOSIS — I10 Essential (primary) hypertension: Secondary | ICD-10-CM

## 2021-04-02 ENCOUNTER — Other Ambulatory Visit (HOSPITAL_BASED_OUTPATIENT_CLINIC_OR_DEPARTMENT_OTHER): Payer: Self-pay | Admitting: Family Medicine

## 2021-04-02 DIAGNOSIS — I10 Essential (primary) hypertension: Secondary | ICD-10-CM

## 2021-05-11 IMAGING — DX DG FOOT COMPLETE 3+V*L*
3 series · 3 of 3 positions shown · non-contrast
Comparison: None.

CLINICAL DATA: Burn on dorsum of foot

EXAM:
LEFT FOOT - COMPLETE 3+ VIEW

[foot supine dp]
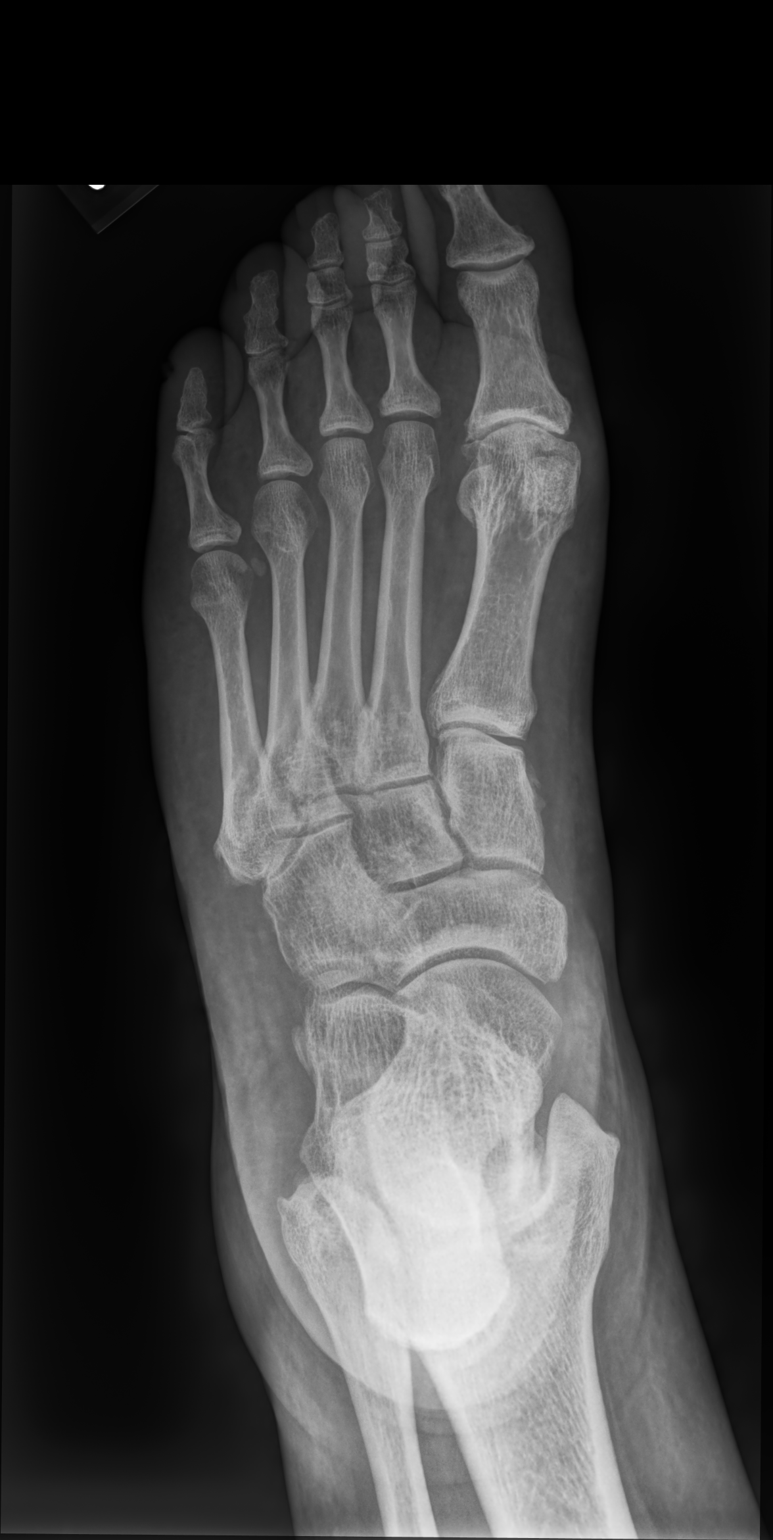

[foot medial oblique]
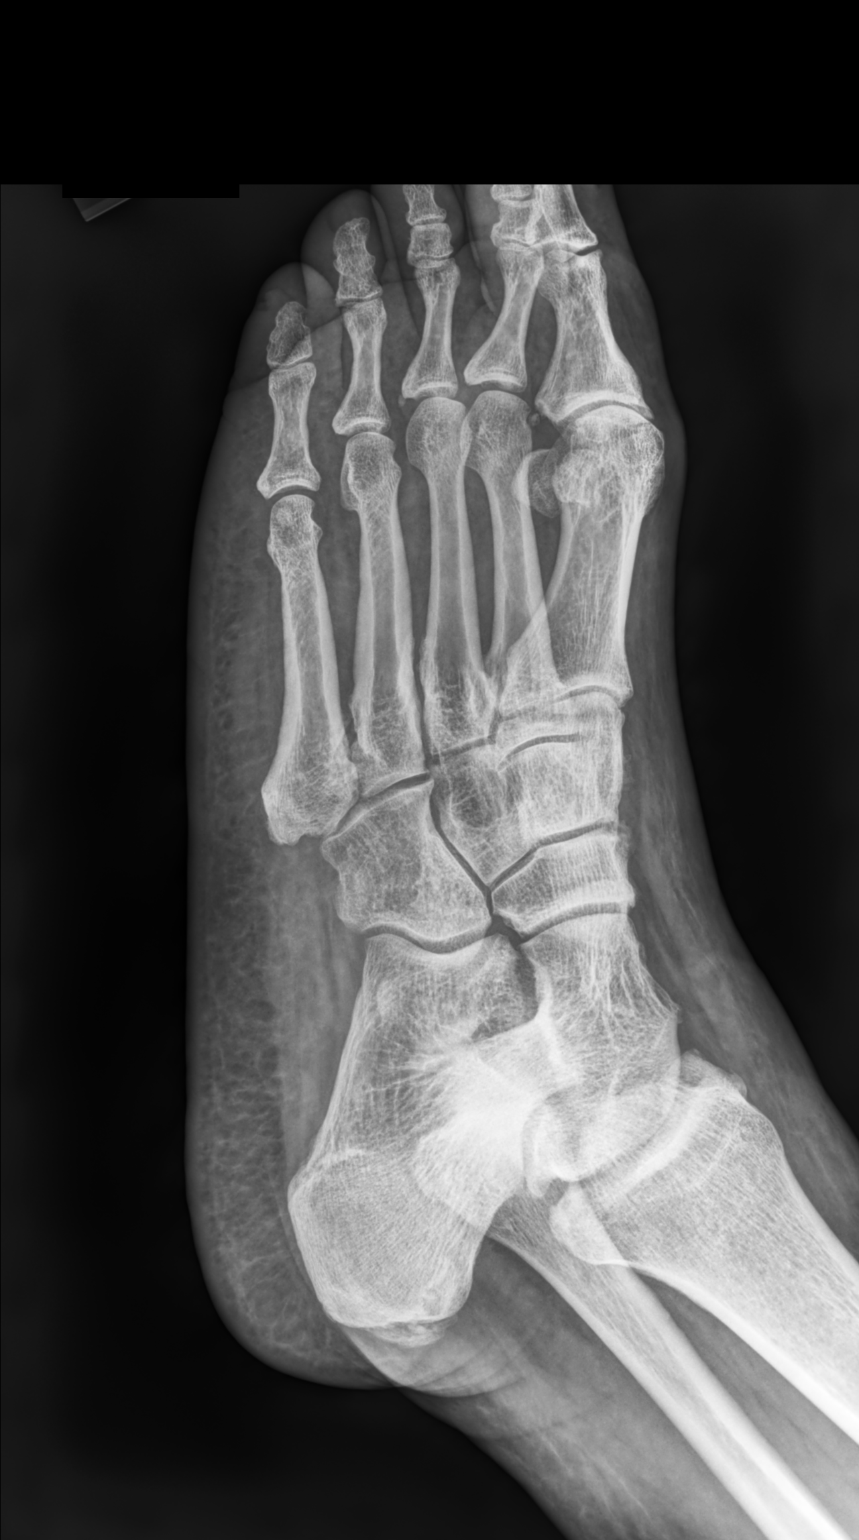

[foot supine lat]
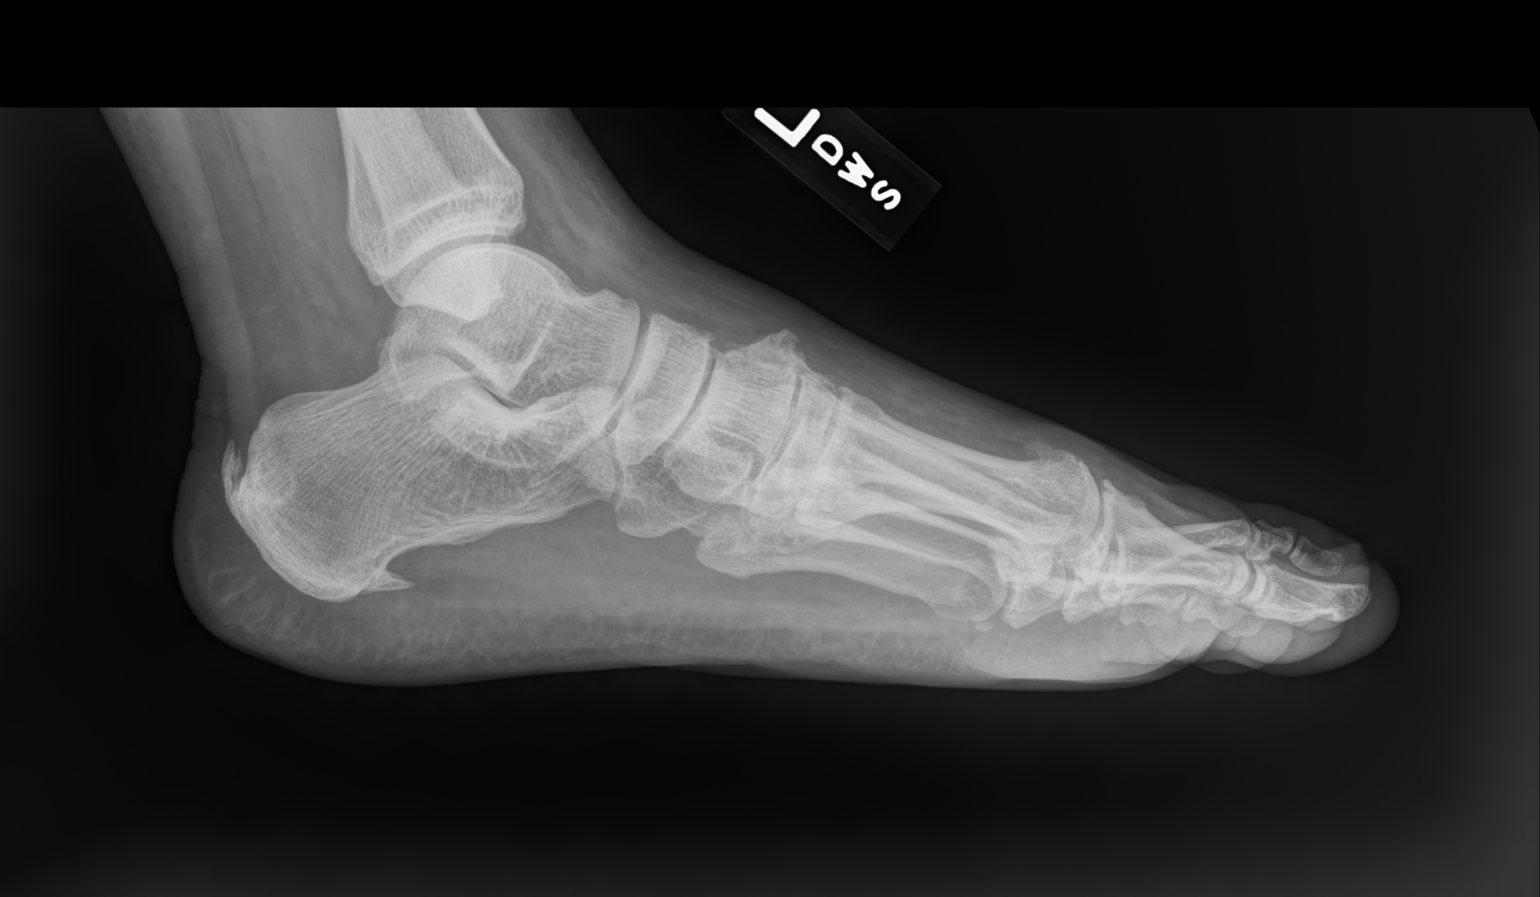

[3 of 3 positions shown; findings below may reference images not displayed]

FINDINGS: No acute fracture or dislocation. Moderate midfoot degenerative
changes with dorsal spurring of the midfoot. Moderate degenerative
changes of the first MTP with dorsal spurring. Plantar calcaneal
enthesophyte. Achilles tendon enthesophyte. No area of erosion or
osseous destruction. No unexpected radiopaque foreign body. Soft
tissues are unremarkable.
IMPRESSION: 1. No acute osseous abnormality.
2. Moderate degenerative changes of the left foot.

## 2021-06-09 ENCOUNTER — Other Ambulatory Visit (HOSPITAL_BASED_OUTPATIENT_CLINIC_OR_DEPARTMENT_OTHER): Payer: Self-pay | Admitting: Family Medicine

## 2021-06-09 DIAGNOSIS — I10 Essential (primary) hypertension: Secondary | ICD-10-CM

## 2021-06-30 ENCOUNTER — Other Ambulatory Visit (HOSPITAL_BASED_OUTPATIENT_CLINIC_OR_DEPARTMENT_OTHER): Payer: Self-pay | Admitting: Family Medicine

## 2021-06-30 DIAGNOSIS — I10 Essential (primary) hypertension: Secondary | ICD-10-CM

## 2021-09-29 ENCOUNTER — Other Ambulatory Visit (HOSPITAL_BASED_OUTPATIENT_CLINIC_OR_DEPARTMENT_OTHER): Payer: Self-pay | Admitting: Family Medicine

## 2021-09-29 DIAGNOSIS — I10 Essential (primary) hypertension: Secondary | ICD-10-CM

## 2021-12-01 ENCOUNTER — Other Ambulatory Visit (HOSPITAL_BASED_OUTPATIENT_CLINIC_OR_DEPARTMENT_OTHER): Payer: Self-pay | Admitting: Family Medicine

## 2021-12-01 DIAGNOSIS — I10 Essential (primary) hypertension: Secondary | ICD-10-CM

## 2021-12-12 IMAGING — CR DG KNEE COMPLETE 4+V*L*
4 series · 4 of 4 positions shown · non-contrast
Comparison: None.

CLINICAL DATA: Chronic left knee pain for 10 years.

EXAM:
LEFT KNEE - COMPLETE 4+ VIEW

[w knee ap left]
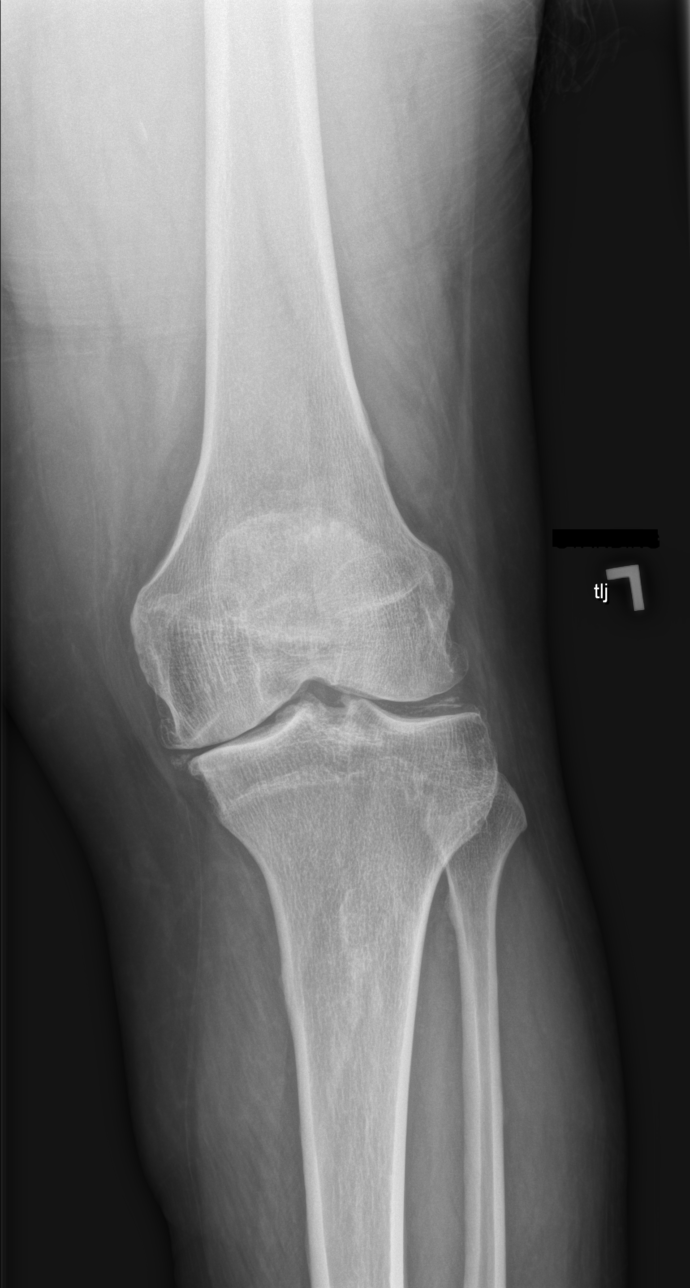

[w knee lat left]
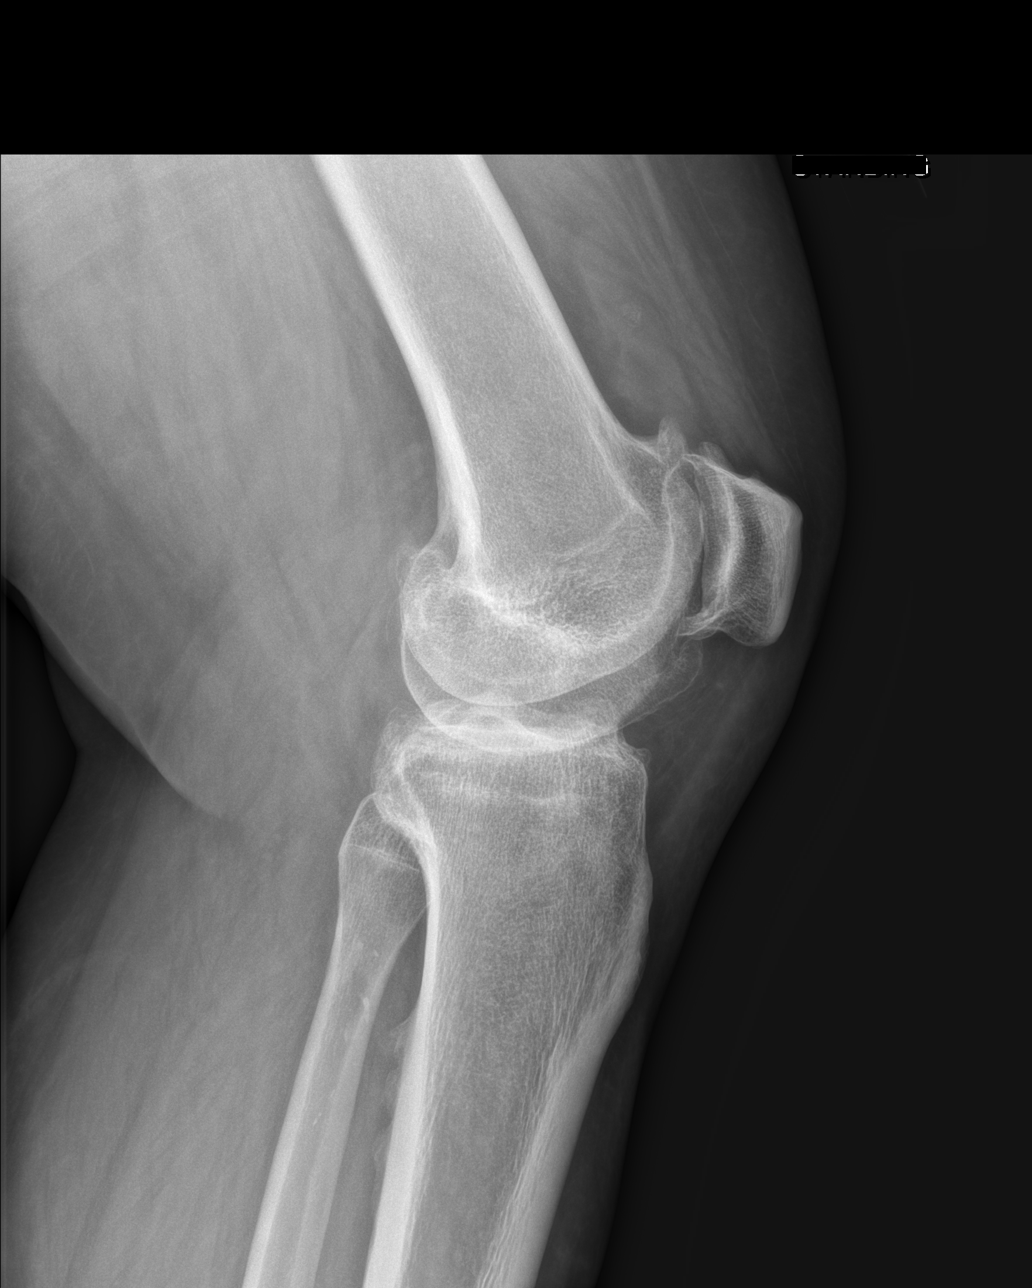

[w knee tunnel pa left]
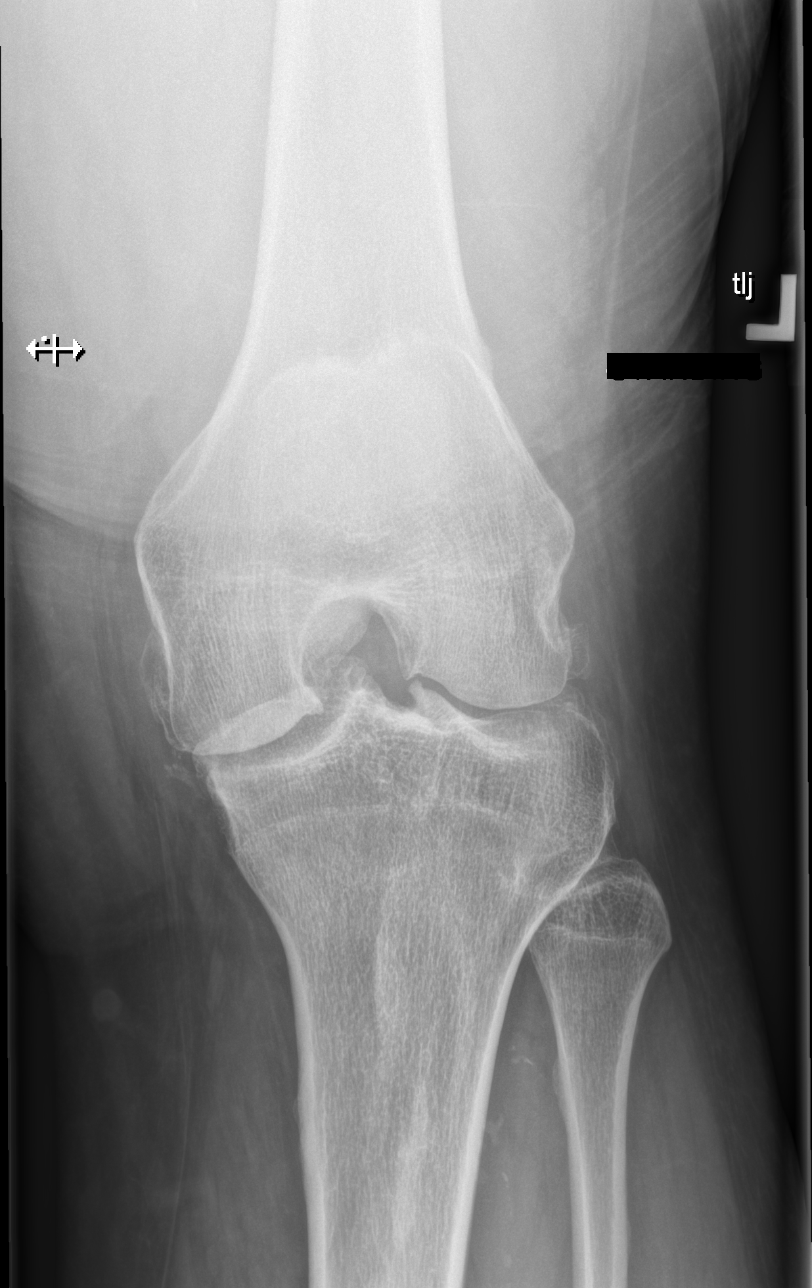

[x knee sunrise left]
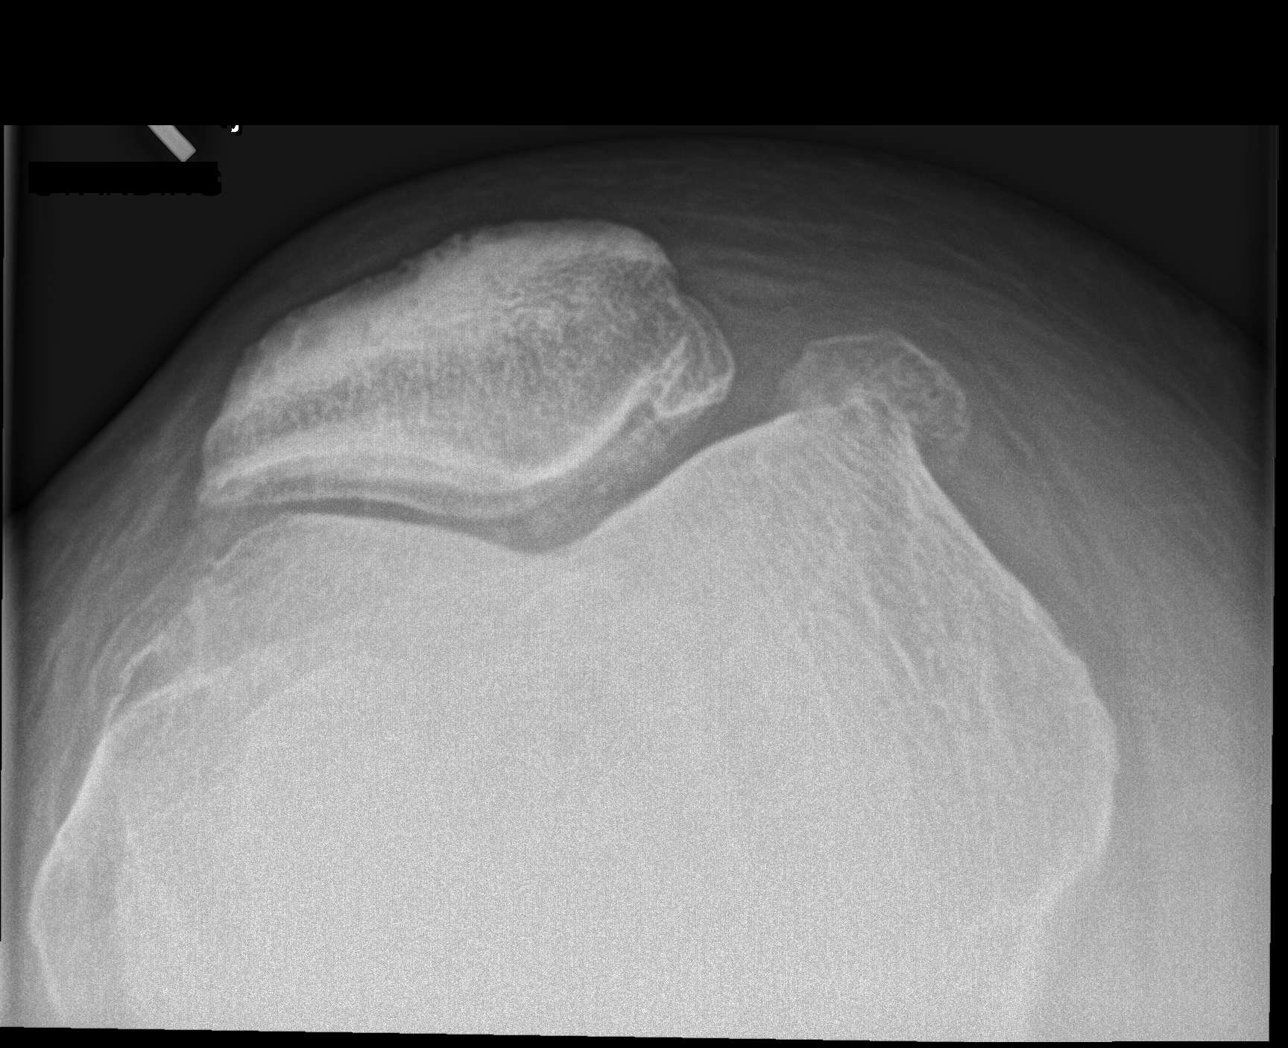

[4 of 4 positions shown; findings below may reference images not displayed]

FINDINGS: Moderate to marked medial and mild lateral compartment joint space
narrowing with osteophyte formation. Marked patellofemoral
compartment joint space narrowing and osteophyte formation. No acute
fracture or dislocation. Small suprapatellar joint effusion.
Chondrocalcinosis within the menisci.
IMPRESSION: Three compartment osteoarthritis, with small suprapatellar joint
effusion.

Chondrocalcinosis within the menisci, consistent with calcium
pyrophosphate deposition disease.

## 2021-12-28 ENCOUNTER — Telehealth (HOSPITAL_BASED_OUTPATIENT_CLINIC_OR_DEPARTMENT_OTHER): Payer: Self-pay | Admitting: Family Medicine

## 2021-12-28 NOTE — Telephone Encounter (Signed)
FMLA received via Fax from [employer] 6 pages Copied and placed in providers box

## 2022-01-02 NOTE — Telephone Encounter (Signed)
Called and LVM for the pt to call the office, we are not sure why the request FMLA --last OV visit was 03-02-21

## 2022-01-05 ENCOUNTER — Encounter (HOSPITAL_BASED_OUTPATIENT_CLINIC_OR_DEPARTMENT_OTHER): Payer: Self-pay | Admitting: Family Medicine

## 2022-01-07 ENCOUNTER — Other Ambulatory Visit (HOSPITAL_BASED_OUTPATIENT_CLINIC_OR_DEPARTMENT_OTHER): Payer: Self-pay

## 2022-01-07 DIAGNOSIS — I10 Essential (primary) hypertension: Secondary | ICD-10-CM

## 2022-01-07 MED ORDER — HYDROCHLOROTHIAZIDE 25 MG PO TABS: 25.0000 mg | ORAL_TABLET | Freq: Every day | ORAL | 1 refills | Status: DC

## 2022-03-25 ENCOUNTER — Other Ambulatory Visit (HOSPITAL_BASED_OUTPATIENT_CLINIC_OR_DEPARTMENT_OTHER): Payer: Self-pay | Admitting: Family Medicine

## 2022-03-25 DIAGNOSIS — I10 Essential (primary) hypertension: Secondary | ICD-10-CM

## 2022-03-25 MED ORDER — METOPROLOL SUCCINATE ER 25 MG PO TB24: 25.0000 mg | ORAL_TABLET | Freq: Every day | ORAL | 2 refills | Status: DC

## 2022-05-08 ENCOUNTER — Encounter (HOSPITAL_BASED_OUTPATIENT_CLINIC_OR_DEPARTMENT_OTHER): Payer: Self-pay

## 2022-06-02 ENCOUNTER — Other Ambulatory Visit (HOSPITAL_BASED_OUTPATIENT_CLINIC_OR_DEPARTMENT_OTHER): Payer: Self-pay | Admitting: Family Medicine

## 2022-06-02 DIAGNOSIS — I10 Essential (primary) hypertension: Secondary | ICD-10-CM

## 2022-06-12 ENCOUNTER — Ambulatory Visit (HOSPITAL_BASED_OUTPATIENT_CLINIC_OR_DEPARTMENT_OTHER): Payer: Managed Care, Other (non HMO) | Admitting: Family Medicine

## 2022-07-01 ENCOUNTER — Encounter (HOSPITAL_BASED_OUTPATIENT_CLINIC_OR_DEPARTMENT_OTHER): Payer: Self-pay

## 2022-07-01 ENCOUNTER — Ambulatory Visit (HOSPITAL_BASED_OUTPATIENT_CLINIC_OR_DEPARTMENT_OTHER): Payer: Managed Care, Other (non HMO) | Admitting: Family Medicine

## 2022-07-01 ENCOUNTER — Other Ambulatory Visit (HOSPITAL_BASED_OUTPATIENT_CLINIC_OR_DEPARTMENT_OTHER): Payer: Self-pay | Admitting: Family Medicine

## 2022-07-01 DIAGNOSIS — I10 Essential (primary) hypertension: Secondary | ICD-10-CM

## 2022-07-02 ENCOUNTER — Telehealth (HOSPITAL_BASED_OUTPATIENT_CLINIC_OR_DEPARTMENT_OTHER): Payer: Self-pay | Admitting: Family Medicine

## 2022-07-02 NOTE — Telephone Encounter (Signed)
Pt was scheduled -for Monday 07-08-22 for injections. I called the pt to advise that the Dr would need to ok, the injections prior to the Appt. ( Cxld appt for Monday) advised pt I would call back to schedule once we received the approval   Pt would like CPE and Injections on the sameday, and pt is aware that there would be a copay if done on the sameday.  Please advise.

## 2022-07-08 ENCOUNTER — Ambulatory Visit (HOSPITAL_BASED_OUTPATIENT_CLINIC_OR_DEPARTMENT_OTHER): Payer: Self-pay | Admitting: Family Medicine

## 2022-07-10 ENCOUNTER — Ambulatory Visit (HOSPITAL_BASED_OUTPATIENT_CLINIC_OR_DEPARTMENT_OTHER): Payer: BC Managed Care – PPO | Admitting: Family Medicine

## 2022-07-10 ENCOUNTER — Encounter (HOSPITAL_BASED_OUTPATIENT_CLINIC_OR_DEPARTMENT_OTHER): Payer: Self-pay | Admitting: Family Medicine

## 2022-07-10 VITALS — BP 143/83 | HR 85 | Temp 97.6°F | Ht 72.0 in | Wt 335.0 lb

## 2022-07-10 DIAGNOSIS — R7303 Prediabetes: Secondary | ICD-10-CM | POA: Diagnosis not present

## 2022-07-10 DIAGNOSIS — M170 Bilateral primary osteoarthritis of knee: Secondary | ICD-10-CM | POA: Diagnosis not present

## 2022-07-10 DIAGNOSIS — I10 Essential (primary) hypertension: Secondary | ICD-10-CM | POA: Diagnosis not present

## 2022-07-10 LAB — HEMOGLOBIN A1C
Est. average glucose Bld gHb Est-mCnc: 128 mg/dL
Hgb A1c MFr Bld: 6.1 % — ABNORMAL HIGH (ref 4.8–5.6)

## 2022-07-10 LAB — BASIC METABOLIC PANEL
BUN/Creatinine Ratio: 8 — ABNORMAL LOW (ref 9–20)
BUN: 11 mg/dL (ref 6–24)
CO2: 21 mmol/L (ref 20–29)
Calcium: 9.9 mg/dL (ref 8.7–10.2)
Chloride: 100 mmol/L (ref 96–106)
Creatinine, Ser: 1.31 mg/dL — ABNORMAL HIGH (ref 0.76–1.27)
Glucose: 101 mg/dL — ABNORMAL HIGH (ref 70–99)
Potassium: 4.5 mmol/L (ref 3.5–5.2)
Sodium: 138 mmol/L (ref 134–144)
eGFR: 65 mL/min/{1.73_m2} (ref >59)

## 2022-07-10 NOTE — Assessment & Plan Note (Signed)
Reports that he has ongoing bilateral knee pain.  Patient does have known osteoarthritis.  Has received prior steroid injections with good response, last injection will about 16 months ago.  Had good relief with those injections.  He is requesting to have repeat steroid injections today due to return of symptoms and prior good response. Reviewed prior imaging, reviewed procedure, risk, benefits.  Patient elected to proceed with repeat steroid injection today, see procedure note above

## 2022-07-10 NOTE — Assessment & Plan Note (Signed)
Last hemoglobin A1c had improved and was at 5.7%, still within prediabetes range.  He is due for recheck at this time.  Does report some nocturia, no new polyuria throughout the day, no polydipsia.  Continue with lifestyle modifications.  Will proceed with hemoglobin A1c checked today for monitoring

## 2022-07-10 NOTE — Progress Notes (Signed)
    Procedures performed today:    Procedure: Injection of bilateral knee joints Verbal informed consent obtained.  Time-out conducted.  Noted no overlying erythema, induration, or other signs of local infection.  Skin prepped in a sterile fashion.  Local anesthesia: Topical Ethyl chloride.  With sterile technique: 2 cc Kenalog 40, 3 cc lidocaine injected easily Completed without difficulty  Procedure was completed initially for right knee and same process was completed on left knee with same medications used Advised to call if fevers/chills, erythema, induration, drainage, or persistent bleeding.  Impression: Technically successful bilateral injections of knee joints.  Independent interpretation of notes and tests performed by another provider:   None.  Brief History, Exam, Impression, and Recommendations:    BP (!) 143/83 (BP Location: Right Arm, Patient Position: Sitting, Cuff Size: Large)   Pulse 85   Temp 97.6 F (36.4 C) (Oral)   Ht 6' (1.829 m)   Wt (!) 335 lb (152 kg)   SpO2 98%   BMI 45.43 kg/m   Patient overdue for follow-up for chronic medical conditions, last appointment was about 16 months ago with recommendation for 57-month follow-up.  HTN (hypertension) Blood pressure borderline in office today.  He reports checking blood pressure occasionally at home.  Indicates that he continues with lisinopril, hydrochlorothiazide, metoprolol.  Does not need refills today.  Denies any issues with chest pain, lightheadedness, headaches, dizziness. At this time, no medication changes to be made, can continue with current medication regimen.  Recommend intermittent monitoring of blood pressure at home, DASH diet  Bilateral primary osteoarthritis of knee Reports that he has ongoing bilateral knee pain.  Patient does have known osteoarthritis.  Has received prior steroid injections with good response, last injection will about 16 months ago.  Had good relief with those injections.   He is requesting to have repeat steroid injections today due to return of symptoms and prior good response. Reviewed prior imaging, reviewed procedure, risk, benefits.  Patient elected to proceed with repeat steroid injection today, see procedure note above  Prediabetes Last hemoglobin A1c had improved and was at 5.7%, still within prediabetes range.  He is due for recheck at this time.  Does report some nocturia, no new polyuria throughout the day, no polydipsia.  Continue with lifestyle modifications.  Will proceed with hemoglobin A1c checked today for monitoring  Patient has questions about assistance with weight loss efforts.  He has utilized conservative measures, no significant weight loss achieved thus far.  Will proceed with labs as above, further discuss medications at time of next office visit when also reviewing labs  Return in about 4 weeks (around 08/07/2022).   ___________________________________________ Tekisha Darcey de [place], MD, ABFM, CAQSM Primary Care and Sports Medicine [facility]

## 2022-07-10 NOTE — Assessment & Plan Note (Signed)
Blood pressure borderline in office today.  He reports checking blood pressure occasionally at home.  Indicates that he continues with lisinopril, hydrochlorothiazide, metoprolol.  Does not need refills today.  Denies any issues with chest pain, lightheadedness, headaches, dizziness. At this time, no medication changes to be made, can continue with current medication regimen.  Recommend intermittent monitoring of blood pressure at home, DASH diet

## 2022-08-08 ENCOUNTER — Encounter (HOSPITAL_BASED_OUTPATIENT_CLINIC_OR_DEPARTMENT_OTHER): Payer: Self-pay | Admitting: Family Medicine

## 2022-08-08 ENCOUNTER — Ambulatory Visit (HOSPITAL_BASED_OUTPATIENT_CLINIC_OR_DEPARTMENT_OTHER): Payer: BC Managed Care – PPO | Admitting: Family Medicine

## 2022-08-08 VITALS — BP 146/89 | HR 91 | Ht 72.0 in | Wt 325.0 lb

## 2022-08-08 DIAGNOSIS — R7303 Prediabetes: Secondary | ICD-10-CM | POA: Diagnosis not present

## 2022-08-08 DIAGNOSIS — I10 Essential (primary) hypertension: Secondary | ICD-10-CM

## 2022-08-08 MED ORDER — SEMAGLUTIDE-WEIGHT MANAGEMENT 0.25 MG/0.5ML ~~LOC~~ SOAJ: 0.2500 mg | SUBCUTANEOUS | 1 refills | Status: AC

## 2022-08-08 NOTE — Progress Notes (Signed)
    Procedures performed today:    None.  Independent interpretation of notes and tests performed by another provider:   None.  Brief History, Exam, Impression, and Recommendations:    BP (!) 146/89   Pulse 91   Ht 6' (1.829 m)   Wt (!) 325 lb (147.4 kg)   SpO2 100%   BMI 44.08 kg/m   Prediabetes Assessment & Plan: Hemoglobin A1c last month had slightly improved from prior readings, remains within prediabetes range.  Patient primarily has been focusing on lifestyle modifications.  He does have questions today regarding medications to assist with weight loss and to help with blood sugars. We discussed medication considerations.  He would be interested in trial of Wegovy if possible to assist with weight loss efforts as well as help to control blood sugars.  We will send prescription and patient will look to initiate this if not cost prohibitive.  As below, if cost prohibitive, we can look to trial oral weight loss medication  Orders: -     Semaglutide-Weight Management; Inject 0.25 mg into the skin once a week.  Dispense: 2 mL; Refill: 1  Primary hypertension Assessment & Plan: Blood pressure borderline in office, still above goal on recheck.  Patient continues with hydrochlorothiazide and lisinopril, taking as instructed.  He additionally takes metoprolol.  No issues with chest pain, lightheadedness, headaches, dizziness.  Does not check blood pressure regularly at home.  Has been working on lifestyle modifications. For now, we can allow for continued work on lifestyle modifications, no changes to medications today Recommend intermittent monitoring of blood pressure at home, DASH diet  Orders: -     Semaglutide-Weight Management; Inject 0.25 mg into the skin once a week.  Dispense: 2 mL; Refill: 1  Severe obesity (BMI >= 40) (HCC) Assessment & Plan: Patient has a working on lifestyle modifications, has had about a 10 pound weight loss since last office visit, congratulated on  this today.  He has questions today about medications to assist with weight loss. Long discussion today reviewing weight loss medications including injectable options including GLP-1 receptor agonist, oral agents including Contrave, orlistat, phentermine.  We discussed potential risk, benefits, cost associated with these various medications as well as the possibility of insurance coverage or lack thereof.  We discussed typical dosing regimen for both injectable and oral medications, proper administration of each.  We discussed potential outcomes with each. After discussion of potential risks and adverse reactions with these medications and potential benefits of each, patient would like to proceed with injectable GLP-1 receptor agonist if possible.  Prescription sent to pharmacy on file, if medication is cost prohibitive for patient, he will let us know and we can look to send an alternative option, preference regarding oral medications would be to initiate Contrave.  Will plan for close follow-up to monitor response to whichever medication patient is able to initiate. Additional consideration is for referral to healthy weight and wellness clinic  Orders: -     Semaglutide-Weight Management; Inject 0.25 mg into the skin once a week.  Dispense: 2 mL; Refill: 1  Spent 32 minutes on this patient encounter, including preparation, chart review, face-to-face counseling with patient and coordination of care, and documentation of encounter   ___________________________________________ Lauren Aguayo de [place], MD, ABFM, CAQSM Primary Care and Sports Medicine [facility]

## 2022-08-11 ENCOUNTER — Encounter (HOSPITAL_BASED_OUTPATIENT_CLINIC_OR_DEPARTMENT_OTHER): Payer: Self-pay | Admitting: Family Medicine

## 2022-08-12 NOTE — Assessment & Plan Note (Signed)
Blood pressure borderline in office, still above goal on recheck.  Patient continues with hydrochlorothiazide and lisinopril, taking as instructed.  He additionally takes metoprolol.  No issues with chest pain, lightheadedness, headaches, dizziness.  Does not check blood pressure regularly at home.  Has been working on lifestyle modifications. For now, we can allow for continued work on lifestyle modifications, no changes to medications today Recommend intermittent monitoring of blood pressure at home, DASH diet

## 2022-08-12 NOTE — Assessment & Plan Note (Signed)
Hemoglobin A1c last month had slightly improved from prior readings, remains within prediabetes range.  Patient primarily has been focusing on lifestyle modifications.  He does have questions today regarding medications to assist with weight loss and to help with blood sugars. We discussed medication considerations.  He would be interested in trial of Wegovy if possible to assist with weight loss efforts as well as help to control blood sugars.  We will send prescription and patient will look to initiate this if not cost prohibitive.  As below, if cost prohibitive, we can look to trial oral weight loss medication

## 2022-08-12 NOTE — Assessment & Plan Note (Signed)
Patient has a working on lifestyle modifications, has had about a 10 pound weight loss since last office visit, congratulated on this today.  He has questions today about medications to assist with weight loss. Long discussion today reviewing weight loss medications including injectable options including GLP-1 receptor agonist, oral agents including Contrave, orlistat, phentermine.  We discussed potential risk, benefits, cost associated with these various medications as well as the possibility of insurance coverage or lack thereof.  We discussed typical dosing regimen for both injectable and oral medications, proper administration of each.  We discussed potential outcomes with each. After discussion of potential risks and adverse reactions with these medications and potential benefits of each, patient would like to proceed with injectable GLP-1 receptor agonist if possible.  Prescription sent to pharmacy on file, if medication is cost prohibitive for patient, he will let us know and we can look to send an alternative option, preference regarding oral medications would be to initiate Contrave.  Will plan for close follow-up to monitor response to whichever medication patient is able to initiate. Additional consideration is for referral to healthy weight and wellness clinic

## 2022-08-28 MED ORDER — NALTREXONE-BUPROPION HCL ER 8-90 MG PO TB12: ORAL_TABLET | ORAL | 0 refills | Status: AC

## 2022-09-10 ENCOUNTER — Telehealth (HOSPITAL_BASED_OUTPATIENT_CLINIC_OR_DEPARTMENT_OTHER): Payer: Self-pay | Admitting: *Deleted

## 2022-09-10 ENCOUNTER — Other Ambulatory Visit (HOSPITAL_BASED_OUTPATIENT_CLINIC_OR_DEPARTMENT_OTHER): Payer: Self-pay | Admitting: *Deleted

## 2022-09-10 ENCOUNTER — Ambulatory Visit (HOSPITAL_BASED_OUTPATIENT_CLINIC_OR_DEPARTMENT_OTHER): Payer: BC Managed Care – PPO | Admitting: Family Medicine

## 2022-09-10 DIAGNOSIS — Z1211 Encounter for screening for malignant neoplasm of colon: Secondary | ICD-10-CM

## 2022-09-10 NOTE — Telephone Encounter (Signed)
Called pt to offer colon cancer screening. Pt advised of cologuard and colonoscopy option. Pt prefers to do the cologuard at this time. Cologuard kit ordered. There are no transportation issues at this time.

## 2022-11-05 ENCOUNTER — Ambulatory Visit (HOSPITAL_BASED_OUTPATIENT_CLINIC_OR_DEPARTMENT_OTHER): Payer: BC Managed Care – PPO

## 2022-11-05 ENCOUNTER — Telehealth (HOSPITAL_BASED_OUTPATIENT_CLINIC_OR_DEPARTMENT_OTHER): Payer: Self-pay | Admitting: *Deleted

## 2022-11-05 NOTE — Telephone Encounter (Signed)
Pt is wondering can bp medication be changed to amlodipine. One of the bp medication is causing frequent urination. Please advise

## 2022-11-11 NOTE — Telephone Encounter (Signed)
Called pt he will call back to schedule in office appt with dr de [place] doesn't know his upcoming schedule and will need to find this out first

## 2022-11-13 ENCOUNTER — Other Ambulatory Visit (HOSPITAL_BASED_OUTPATIENT_CLINIC_OR_DEPARTMENT_OTHER): Payer: Self-pay | Admitting: Family Medicine

## 2022-11-13 DIAGNOSIS — R195 Other fecal abnormalities: Secondary | ICD-10-CM

## 2022-11-13 DIAGNOSIS — Z1211 Encounter for screening for malignant neoplasm of colon: Secondary | ICD-10-CM

## 2022-12-24 ENCOUNTER — Other Ambulatory Visit (HOSPITAL_BASED_OUTPATIENT_CLINIC_OR_DEPARTMENT_OTHER): Payer: Self-pay | Admitting: Family Medicine

## 2022-12-24 DIAGNOSIS — I10 Essential (primary) hypertension: Secondary | ICD-10-CM

## 2023-01-05 ENCOUNTER — Other Ambulatory Visit (HOSPITAL_BASED_OUTPATIENT_CLINIC_OR_DEPARTMENT_OTHER): Payer: Self-pay | Admitting: Family Medicine

## 2023-01-05 DIAGNOSIS — I10 Essential (primary) hypertension: Secondary | ICD-10-CM

## 2023-01-12 ENCOUNTER — Other Ambulatory Visit (HOSPITAL_BASED_OUTPATIENT_CLINIC_OR_DEPARTMENT_OTHER): Payer: Self-pay | Admitting: Family Medicine

## 2023-01-12 DIAGNOSIS — I10 Essential (primary) hypertension: Secondary | ICD-10-CM

## 2023-01-15 ENCOUNTER — Ambulatory Visit (HOSPITAL_BASED_OUTPATIENT_CLINIC_OR_DEPARTMENT_OTHER): Payer: BC Managed Care – PPO | Admitting: Family Medicine

## 2023-01-16 ENCOUNTER — Encounter (HOSPITAL_BASED_OUTPATIENT_CLINIC_OR_DEPARTMENT_OTHER): Payer: Self-pay | Admitting: Family Medicine

## 2023-07-13 ENCOUNTER — Other Ambulatory Visit (HOSPITAL_BASED_OUTPATIENT_CLINIC_OR_DEPARTMENT_OTHER): Payer: Self-pay | Admitting: Family Medicine

## 2023-07-13 DIAGNOSIS — I10 Essential (primary) hypertension: Secondary | ICD-10-CM

## 2023-10-25 ENCOUNTER — Other Ambulatory Visit (HOSPITAL_BASED_OUTPATIENT_CLINIC_OR_DEPARTMENT_OTHER): Payer: Self-pay | Admitting: Family Medicine

## 2023-10-25 DIAGNOSIS — I10 Essential (primary) hypertension: Secondary | ICD-10-CM
